# Patient Record
Sex: Female | Born: 1935 | Race: White | Hispanic: No | Marital: Married | Smoking: Never smoker
Health system: Southern US, Community
[De-identification: ages and names within clinical notes are randomized; demographics above are authoritative.]

## PROBLEM LIST (undated history)

## (undated) DIAGNOSIS — E7800 Pure hypercholesterolemia, unspecified: Secondary | ICD-10-CM

## (undated) DIAGNOSIS — E039 Hypothyroidism, unspecified: Secondary | ICD-10-CM

## (undated) DIAGNOSIS — I10 Essential (primary) hypertension: Secondary | ICD-10-CM

## (undated) HISTORY — DX: Essential (primary) hypertension: I10

## (undated) HISTORY — DX: Pure hypercholesterolemia, unspecified: E78.00

## (undated) HISTORY — PX: CATARACT EXTRACTION: SUR2

## (undated) HISTORY — DX: Hypothyroidism, unspecified: E03.9

## (undated) HISTORY — PX: TONSILLECTOMY AND ADENOIDECTOMY: SUR1326

---

## 2005-03-10 ENCOUNTER — Other Ambulatory Visit: Admission: RE | Admit: 2005-03-10 | Discharge: 2005-03-10 | Payer: Self-pay | Admitting: Internal Medicine

## 2006-03-16 ENCOUNTER — Ambulatory Visit (HOSPITAL_COMMUNITY): Admission: RE | Admit: 2006-03-16 | Discharge: 2006-03-16 | Payer: Self-pay | Admitting: Internal Medicine

## 2007-04-11 ENCOUNTER — Other Ambulatory Visit: Admission: RE | Admit: 2007-04-11 | Discharge: 2007-04-11 | Payer: Self-pay | Admitting: Internal Medicine

## 2012-12-14 ENCOUNTER — Ambulatory Visit (INDEPENDENT_AMBULATORY_CARE_PROVIDER_SITE_OTHER): Payer: Medicare Other | Admitting: Cardiology

## 2012-12-14 ENCOUNTER — Encounter: Payer: Self-pay | Admitting: Cardiology

## 2012-12-14 VITALS — BP 118/78 | HR 80 | Ht 66.0 in | Wt 147.0 lb

## 2012-12-14 DIAGNOSIS — R072 Precordial pain: Secondary | ICD-10-CM

## 2012-12-14 MED ORDER — NITROGLYCERIN 0.4 MG SL SUBL: 0.4000 mg | SUBLINGUAL_TABLET | SUBLINGUAL | Status: DC | PRN

## 2012-12-14 MED ORDER — METOPROLOL TARTRATE 25 MG PO TABS: 25.0000 mg | ORAL_TABLET | Freq: Every day | ORAL | Status: DC

## 2012-12-14 NOTE — Patient Instructions (Addendum)
The current medical regimen is effective;  continue present plan and medications.  Follow up with Dr Antoine Poche after your trip.

## 2012-12-14 NOTE — Progress Notes (Signed)
HPI The patient presents for evaluation of an abnormal stress test. She has no prior cardiac history. She did mention recently to her primary provider that she had some chest discomfort when she has been walking in the field. This will happen after a quarter of a mile. However, she's able to complete a mile in the discomfort goes away. She describes the mid brief chest discomfort. It is mild in intensity. There is no radiation to her neck or to her arms. She does not have associated symptoms. It only lasts for a few seconds at a time. She can do other vigorous activities such as vacuuming and heavy housework without bringing on this discomfort. It goes away spontaneously. It is not reproducible every time she exercises very she was sent for a stress echocardiogram which didn't suggest mild mid anterior wall hypokinesis. She did have shortness of breath. There were no diagnostic EKG changes. She was given a prescription for metoprolol and sublingual nitroglycerin but has not yet had these filled.  Allergies  Allergen Reactions  . Ace Inhibitors   . Amoxicillin   . Celebrex (Celecoxib)   . Crestor (Rosuvastatin)   . Iodine   . Statins     Current Outpatient Prescriptions  Medication Sig Dispense Refill  . aspirin 81 MG tablet Take 81 mg by mouth daily.      Marland Kitchen atorvastatin (LIPITOR) 10 MG tablet Take 1 tablet by mouth daily.      . Calcium Carbonate-Vitamin D (CALTRATE 600+D PO) Take by mouth 2 (two) times daily.      . fish oil-omega-3 fatty acids 1000 MG capsule Take 2 g by mouth daily.      Marland Kitchen levothyroxine (SYNTHROID, LEVOTHROID) 50 MCG tablet Take 50 mcg by mouth daily before breakfast.      . losartan-hydrochlorothiazide (HYZAAR) 100-25 MG per tablet Take 1 tablet by mouth daily.      . Multiple Vitamins-Calcium (ONE-A-DAY WOMENS PO) Take by mouth daily.      . polycarbophil (FIBERCON) 625 MG tablet Take 625 mg by mouth 2 (two) times daily.       No current facility-administered  medications for this visit.    Past Medical History  Diagnosis Date  . Chest pain   . Hypertension   . Hypothyroidism   . Hypercholesterolemia     Past Surgical History  Procedure Laterality Date  . Cataract extraction    . Tonsillectomy and adenoidectomy      Family History  Problem Relation Age of Onset  . CAD Father   . Hypertension Father   . Heart attack Father   . Lung cancer Sister     History   Social History  . Marital Status: Married    Spouse Name: N/A    Number of Children: N/A  . Years of Education: N/A   Occupational History  . Not on file.   Social History Main Topics  . Smoking status: Never Smoker   . Smokeless tobacco: Not on file  . Alcohol Use: Not on file  . Drug Use: Not on file  . Sexually Active: Not on file   Other Topics Concern  . Not on file   Social History Narrative  . No narrative on file    ROS:  Positive for back pain, occasional lightheadedness, urinary frequency, joint pains. Otherwise as stated in the history of present illness and negative for all other systems.  PHYSICAL EXAM BP 118/78  Pulse 80  Ht 5\' 6"  (1.676 m)  Wt 147 lb (66.679 kg)  BMI 23.74 kg/m2  SpO2 96% GENERAL:  Well appearing HEENT:  Pupils equal round and reactive, fundi not visualized, oral mucosa unremarkable NECK:  No jugular venous distention, waveform within normal limits, carotid upstroke brisk and symmetric, no bruits, no thyromegaly LYMPHATICS:  No cervical, inguinal adenopathy LUNGS:  Clear to auscultation bilaterally BACK:  No CVA tenderness CHEST:  Unremarkable HEART:  PMI not displaced or sustained,S1 and S2 within normal limits, no S3, no S4, no clicks, no rubs, slight apical systolic early peaking murmur, no diastolic murmurs ABD:  Flat, positive bowel sounds normal in frequency in pitch, no bruits, no rebound, no guarding, no midline pulsatile mass, no hepatomegaly, no splenomegaly EXT:  2 plus pulses throughout, no edema, no  cyanosis no clubbing SKIN:  No rashes no nodules NEURO:  Cranial nerves II through XII grossly intact, motor grossly intact throughout PSYCH:  Cognitively intact, oriented to person place and time   EKG:  Sinus rhythm, rate 65, axis within normal limits, intervals within normal limits, no acute ST-T wave changes.  11/09/12  ASSESSMENT AND PLAN   ABNORMAL STRESS TEST:  This stress test can be interpreted as low risk. She has chest pain that might be Class I angina. She has not yet started medical therapy. Therefore, current guidelines would suggest medical management and then reinterpretation of symptoms.  I agree with Toprol-XL 25 mg which has been prescribed. We also discussed when necessary dosing of nitroglycerin.

## 2013-02-21 ENCOUNTER — Encounter: Payer: Self-pay | Admitting: Cardiology

## 2013-03-01 ENCOUNTER — Encounter: Payer: Self-pay | Admitting: Cardiology

## 2013-03-01 ENCOUNTER — Ambulatory Visit (INDEPENDENT_AMBULATORY_CARE_PROVIDER_SITE_OTHER): Payer: Medicare Other | Admitting: Cardiology

## 2013-03-01 ENCOUNTER — Encounter: Payer: Self-pay | Admitting: *Deleted

## 2013-03-01 VITALS — BP 130/84 | HR 78 | Ht 66.0 in | Wt 151.0 lb

## 2013-03-01 DIAGNOSIS — Z01810 Encounter for preprocedural cardiovascular examination: Secondary | ICD-10-CM

## 2013-03-01 DIAGNOSIS — R072 Precordial pain: Secondary | ICD-10-CM

## 2013-03-01 LAB — BASIC METABOLIC PANEL
CO2: 32 mEq/L (ref 19–32)
Glucose, Bld: 90 mg/dL (ref 70–99)
Potassium: 3.6 mEq/L (ref 3.5–5.1)
Sodium: 134 mEq/L — ABNORMAL LOW (ref 135–145)

## 2013-03-01 LAB — CBC
Hemoglobin: 12.3 g/dL (ref 12.0–15.0)
MCHC: 34.1 g/dL (ref 30.0–36.0)
MCV: 89.6 fl (ref 78.0–100.0)
Platelets: 245 10*3/uL (ref 150.0–400.0)

## 2013-03-01 NOTE — Progress Notes (Signed)
HPI The patient presents for evaluation of an abnormal stress test. She has no prior cardiac history. She did mention recently to her primary provider that she had some chest discomfort when she has been walking in the field. She was sent for a stress echocardiogram which did suggest mild mid anterior wall hypokinesis. She did have shortness of breath. There were no diagnostic EKG changes. We had a long discussion about this at that time we decided since that his symptoms were somewhat low risk and class I and the stress echo results were not high risk that we would manage this conservatively. She actually went out of the country on some church work and did quite a bit of walking.  With this she didn't have any overt symptoms. However, upon coming back she's been getting some increase in discomfort. This might have been with activities. She describes it as an increasing burning discomfort. She says it does go away quickly. She is noticing it with less physical activity. She's not describing associated nausea vomiting or diaphoresis. She's not having palpitations, presyncope or syncope.   Allergies  Allergen Reactions  . Ace Inhibitors   . Amoxicillin   . Celebrex [Celecoxib]   . Crestor [Rosuvastatin]   . Iodine   . Statins     Current Outpatient Prescriptions  Medication Sig Dispense Refill  . aspirin 81 MG tablet Take 81 mg by mouth daily.      Marland Kitchen atorvastatin (LIPITOR) 10 MG tablet Take 1 tablet by mouth daily.      . Calcium Carbonate-Vitamin D (CALTRATE 600+D PO) Take by mouth 2 (two) times daily.      . fish oil-omega-3 fatty acids 1000 MG capsule Take 2 g by mouth daily.      Marland Kitchen levothyroxine (SYNTHROID, LEVOTHROID) 50 MCG tablet Take 50 mcg by mouth daily before breakfast.      . losartan-hydrochlorothiazide (HYZAAR) 100-25 MG per tablet Take 1 tablet by mouth daily.      . Multiple Vitamins-Calcium (ONE-A-DAY WOMENS PO) Take by mouth daily.      . nitroGLYCERIN (NITROSTAT) 0.4 MG SL  tablet Place 1 tablet (0.4 mg total) under the tongue every 5 (five) minutes as needed for chest pain.  25 tablet  prn  . polycarbophil (FIBERCON) 625 MG tablet Take 625 mg by mouth 2 (two) times daily.      Marland Kitchen amLODipine (NORVASC) 2.5 MG tablet TAKE ONE TABLET DAILY       No current facility-administered medications for this visit.    Past Medical History  Diagnosis Date  . Hypertension   . Hypothyroidism   . Hypercholesterolemia     Past Surgical History  Procedure Laterality Date  . Cataract extraction    . Tonsillectomy and adenoidectomy      ROS:  Positive for back pain, occasional lightheadedness, urinary frequency, joint pains. Otherwise as stated in the history of present illness and negative for all other systems.  PHYSICAL EXAM BP 130/84  Pulse 78  Ht 5\' 6"  (1.676 m)  Wt 151 lb (68.493 kg)  BMI 24.38 kg/m2  SpO2 98% GENERAL:  Well appearing HEENT:  Pupils equal round and reactive, fundi not visualized, oral mucosa unremarkable NECK:  No jugular venous distention, waveform within normal limits, carotid upstroke brisk and symmetric, no bruits, no thyromegaly LYMPHATICS:  No cervical, inguinal adenopathy LUNGS:  Clear to auscultation bilaterally BACK:  No CVA tenderness CHEST:  Unremarkable HEART:  PMI not displaced or sustained,S1 and S2 within normal limits,  no S3, no S4, no clicks, no rubs, slight apical systolic early peaking murmur, no diastolic murmurs ABD:  Flat, positive bowel sounds normal in frequency in pitch, no bruits, no rebound, no guarding, no midline pulsatile mass, no hepatomegaly, no splenomegaly EXT:  2 plus pulses throughout, no edema, no cyanosis no clubbing SKIN:  No rashes no nodules NEURO:  Cranial nerves II through XII grossly intact, motor grossly intact throughout PSYCH:  Cognitively intact, oriented to person place and time   EKG:  Sinus rhythm, rate 78, axis within normal limits, intervals within normal limits, no acute ST-T wave  changes.  03/01/2013  ASSESSMENT AND PLAN  Chest pain:  This seems to represent class III angina as it getting worse. She did have an abnormal stress perfusion study. Therefore, cardiac catheterization is indicated. The patient understands that risks included but are not limited to stroke (1 in 1000), death (1 in 1000), kidney failure [usually temporary] (1 in 500), bleeding (1 in 200), allergic reaction [possibly serious] (1 in 200).  The patient understands and agrees to proceed.     HTN:  The blood pressure is at target. No change in medications is indicated. We will continue with therapeutic lifestyle changes (TLC).

## 2013-03-01 NOTE — Patient Instructions (Addendum)
The current medical regimen is effective;  continue present plan and medications.  Your physician has requested that you have a cardiac catheterization. Cardiac catheterization is used to diagnose and/or treat various heart conditions. Doctors may recommend this procedure for a number of different reasons. The most common reason is to evaluate chest pain. Chest pain can be a symptom of coronary artery disease (CAD), and cardiac catheterization can show whether plaque is narrowing or blocking your heart's arteries. This procedure is also used to evaluate the valves, as well as measure the blood flow and oxygen levels in different parts of your heart. For further information please visit [email or web address] Please follow instruction sheet, as given.  Follow up will be scheduled after this procedure.

## 2013-03-02 ENCOUNTER — Encounter (HOSPITAL_BASED_OUTPATIENT_CLINIC_OR_DEPARTMENT_OTHER)
Admission: RE | Disposition: A | Discharge status: Home / Self Care | Payer: Self-pay | Source: Ambulatory Visit | Attending: Cardiology

## 2013-03-02 ENCOUNTER — Inpatient Hospital Stay (HOSPITAL_BASED_OUTPATIENT_CLINIC_OR_DEPARTMENT_OTHER)
Admission: RE | Admit: 2013-03-02 | Discharge: 2013-03-02 | Disposition: A | Discharge status: Home / Self Care | Payer: Medicare Other | Source: Ambulatory Visit | Attending: Cardiology | Admitting: Cardiology

## 2013-03-02 DIAGNOSIS — R9439 Abnormal result of other cardiovascular function study: Secondary | ICD-10-CM | POA: Insufficient documentation

## 2013-03-02 DIAGNOSIS — R079 Chest pain, unspecified: Secondary | ICD-10-CM | POA: Insufficient documentation

## 2013-03-02 DIAGNOSIS — I2510 Atherosclerotic heart disease of native coronary artery without angina pectoris: Secondary | ICD-10-CM | POA: Insufficient documentation

## 2013-03-02 SURGERY — JV LEFT HEART CATHETERIZATION WITH CORONARY ANGIOGRAM

## 2013-03-02 MED ORDER — SODIUM CHLORIDE 0.9 % IV SOLN: INTRAVENOUS | Status: DC

## 2013-03-02 MED ORDER — METHYLPREDNISOLONE SODIUM SUCC 125 MG IJ SOLR
125.0000 mg | Freq: Once | INTRAMUSCULAR | Status: DC
  Filled 2013-03-02: qty 2

## 2013-03-02 MED ORDER — ONDANSETRON HCL 4 MG/2ML IJ SOLN: 4.0000 mg | Freq: Four times a day (QID) | INTRAMUSCULAR | Status: DC | PRN

## 2013-03-02 MED ORDER — ACETAMINOPHEN 325 MG PO TABS: 650.0000 mg | ORAL_TABLET | ORAL | Status: DC | PRN

## 2013-03-02 NOTE — Progress Notes (Signed)
Voided 300cc of clear yellow urine.  Tolerated well

## 2013-03-02 NOTE — OR Nursing (Signed)
Meal served 

## 2013-03-02 NOTE — OR Nursing (Signed)
Discharge instructions reviewed and signed, pt stated understanding, ambulated in hall without difficulty, transported to husband's car via wheelchair

## 2013-03-02 NOTE — OR Nursing (Signed)
+  Allen's test right hand 

## 2013-03-02 NOTE — CV Procedure (Signed)
   Cardiac Catheterization Procedure Note  Name: Shelia Jimenez MRN: [id number] DOB: 03-20-1933  Procedure: Left Heart Cath, Selective Coronary Angiography, LV angiography  Indication:   Abnormal stress test with mild anterior wall motion abnormality.  Chest pain.   Procedural details: The right groin was prepped, draped, and anesthetized with 1% lidocaine. Using modified Seldinger technique, a 5 French sheath was introduced into the right femoral artery. Standard Judkins catheters were used for coronary angiography and left ventriculography. Catheter exchanges were performed over a guidewire. There were no immediate procedural complications. The patient was transferred to the post catheterization recovery area for further monitoring.  Procedural Findings:   Hemodynamics:     AO 124/86    LV 129/16   Coronary angiography:   Coronary dominance: Right  Left mainstem:   Normal, short  Left anterior descending (LAD):   Mild calcification with mid long 25%.  D1 and D2 small and normal.  Left circumflex (LCx):  AV groove short and normal.  Very large OM with multiple branches.  Normal throughout its course.    Right coronary artery (RCA):  Ostial 25%.  Large vessel wrapping the apex. PDA small and normal.    Left ventriculography: Left ventricular systolic function is normal, LVEF is estimated at 55%, there is no significant mitral regurgitation   Final Conclusions:   Mild nonobstructive plaque.  NL LV function  Recommendations:  No further cardiac work up.   Fayrene Fearing Shayda Kalka 03/02/2013, 11:30 AM

## 2013-03-02 NOTE — OR Nursing (Signed)
TR band removed, small pulsatile hematoma present, pressure held for 15 mins by M. Wright, hematoma reduced and pt stated relief of pain at site.

## 2013-03-02 NOTE — OR Nursing (Signed)
Dr Antoine Poche at bedside at 1140 to discuss results and treatment plan with pt and family

## 2013-03-17 ENCOUNTER — Ambulatory Visit (INDEPENDENT_AMBULATORY_CARE_PROVIDER_SITE_OTHER): Payer: Medicare Other | Admitting: Nurse Practitioner

## 2013-03-17 ENCOUNTER — Encounter: Payer: Self-pay | Admitting: Nurse Practitioner

## 2013-03-17 VITALS — BP 126/80 | HR 72 | Ht 66.0 in | Wt 151.0 lb

## 2013-03-17 DIAGNOSIS — Z9889 Other specified postprocedural states: Secondary | ICD-10-CM

## 2013-03-17 NOTE — Patient Instructions (Addendum)
Your heart catheterization turned out very good.  You do not need further heart testing from our standpoint  We will see you back as needed.   Call the [facility] office at [phone] if you have any questions, problems or concerns.

## 2013-03-17 NOTE — Progress Notes (Signed)
Annielee Jemmott Date of Birth: [birth year] Medical Record #[id number]  History of Present Illness: Ms. Kasal is seen back today for a post cath visit. She is seen for Dr. Antoine Poche. She has a history of HTN, HLD, & hypothyroidism.   Most recently seen earlier this month with class III angina and referred for cardiac cath. Results below. Has mild nonobstructive CAD - no further work up was felt to be needed.   Comes in today. Here with her husband. She is doing well. Tells me that she is really doing ok - thought she was doing ok prior - not really interested in further work up. No problems with her wrist.    Current Outpatient Prescriptions  Medication Sig Dispense Refill  . aspirin 81 MG tablet Take 81 mg by mouth daily.      Marland Kitchen atorvastatin (LIPITOR) 10 MG tablet Take 1 tablet by mouth daily.      . Calcium Carbonate-Vitamin D (CALTRATE 600+D PO) Take by mouth 2 (two) times daily.      . fish oil-omega-3 fatty acids 1000 MG capsule Take 2 g by mouth daily.      Marland Kitchen levothyroxine (SYNTHROID, LEVOTHROID) 50 MCG tablet Take 50 mcg by mouth daily before breakfast.      . losartan-hydrochlorothiazide (HYZAAR) 100-25 MG per tablet Take 1 tablet by mouth daily.      . Multiple Vitamins-Calcium (ONE-A-DAY WOMENS PO) Take by mouth daily.      . nitroGLYCERIN (NITROSTAT) 0.4 MG SL tablet Place 1 tablet (0.4 mg total) under the tongue every 5 (five) minutes as needed for chest pain.  25 tablet  prn  . polycarbophil (FIBERCON) 625 MG tablet Take 625 mg by mouth 2 (two) times daily.      Marland Kitchen amLODipine (NORVASC) 2.5 MG tablet TAKE ONE TABLET DAILY       No current facility-administered medications for this visit.    Allergies  Allergen Reactions  . Ace Inhibitors   . Amoxicillin   . Celebrex [Celecoxib]   . Crestor [Rosuvastatin]   . Iodine   . Statins     Past Medical History  Diagnosis Date  . Hypertension   . Hypothyroidism   . Hypercholesterolemia     Past Surgical History  Procedure  Laterality Date  . Cataract extraction    . Tonsillectomy and adenoidectomy      History  Smoking status  . Never Smoker   Smokeless tobacco  . Not on file    History  Alcohol Use No    Family History  Problem Relation Age of Onset  . CAD Father 57  . Hypertension Father   . Lung cancer Sister   . Aneurysm Mother     "heart aneurysm"    Review of Systems: The review of systems is per the HPI.  All other systems were reviewed and are negative.  Physical Exam: BP 126/80  Pulse 72  Ht 5\' 6"  (1.676 m)  Wt 151 lb (68.493 kg)  BMI 24.38 kg/m2 Patient is very pleasant and in no acute distress. Skin is warm and dry. Color is normal.  HEENT is unremarkable. Normocephalic/atraumatic. PERRL. Sclera are nonicteric. Neck is supple. No masses. No JVD. Lungs are clear. Cardiac exam shows a regular rate and rhythm. Abdomen is soft. Extremities are without edema. Gait and ROM are intact. No gross neurologic deficits noted.  LABORATORY DATA: Lab Results  Component Value Date   WBC 6.8 03/01/2013   HGB 12.3 03/01/2013   HCT 36.2  03/01/2013   PLT 245.0 03/01/2013   GLUCOSE 90 03/01/2013   NA 134* 03/01/2013   K 3.6 03/01/2013   CL 96 03/01/2013   CREATININE 1.0 03/01/2013   BUN 17 03/01/2013   CO2 32 03/01/2013   INR 1.1* 03/01/2013   Coronary angiography:  Coronary dominance: Right  Left mainstem: Normal, short  Left anterior descending (LAD): Mild calcification with mid long 25%. D1 and D2 small and normal.  Left circumflex (LCx): AV groove short and normal. Very large OM with multiple branches. Normal throughout its course.  Right coronary artery (RCA): Ostial 25%. Large vessel wrapping the apex. PDA small and normal.  Left ventriculography: Left ventricular systolic function is normal, LVEF is estimated at 55%, there is no significant mitral regurgitation  Final Conclusions: Mild nonobstructive plaque. NL LV function  Recommendations: No further cardiac work up.  Fayrene Fearing Hochrein  03/02/2013, 11:30  AM   Assessment / Plan: 1. S/P cardiac cath - no obstructive disease - normal EF - felt to be stable from our standpoint. She is not interested in further work up.   2. HTN  3. HLD  She will see her PCP as needed. We will see back prn. Otherwise defer to her PCP.  Patient is agreeable to this plan and will call if any problems develop in the interim.   Rosalio Macadamia, RN, ANP-C [facility] [address] [place], [place]  [address]

## 2013-06-29 HISTORY — PX: MENISECTOMY: SHX5181

## 2014-09-25 DIAGNOSIS — M25561 Pain in right knee: Secondary | ICD-10-CM | POA: Diagnosis not present

## 2014-09-25 DIAGNOSIS — M4806 Spinal stenosis, lumbar region: Secondary | ICD-10-CM | POA: Diagnosis not present

## 2014-09-25 DIAGNOSIS — E782 Mixed hyperlipidemia: Secondary | ICD-10-CM | POA: Diagnosis not present

## 2014-09-25 DIAGNOSIS — E038 Other specified hypothyroidism: Secondary | ICD-10-CM | POA: Diagnosis not present

## 2014-09-25 DIAGNOSIS — E780 Pure hypercholesterolemia: Secondary | ICD-10-CM | POA: Diagnosis not present

## 2014-09-25 DIAGNOSIS — I10 Essential (primary) hypertension: Secondary | ICD-10-CM | POA: Diagnosis not present

## 2014-10-15 DIAGNOSIS — M1711 Unilateral primary osteoarthritis, right knee: Secondary | ICD-10-CM | POA: Diagnosis not present

## 2014-10-31 DIAGNOSIS — M25561 Pain in right knee: Secondary | ICD-10-CM | POA: Diagnosis not present

## 2014-10-31 DIAGNOSIS — M1711 Unilateral primary osteoarthritis, right knee: Secondary | ICD-10-CM | POA: Diagnosis not present

## 2014-11-07 DIAGNOSIS — M25561 Pain in right knee: Secondary | ICD-10-CM | POA: Diagnosis not present

## 2014-11-07 DIAGNOSIS — M1711 Unilateral primary osteoarthritis, right knee: Secondary | ICD-10-CM | POA: Diagnosis not present

## 2014-11-14 DIAGNOSIS — M1711 Unilateral primary osteoarthritis, right knee: Secondary | ICD-10-CM | POA: Diagnosis not present

## 2015-01-16 DIAGNOSIS — J069 Acute upper respiratory infection, unspecified: Secondary | ICD-10-CM | POA: Diagnosis not present

## 2015-02-19 DIAGNOSIS — H3531 Nonexudative age-related macular degeneration: Secondary | ICD-10-CM | POA: Diagnosis not present

## 2015-03-11 DIAGNOSIS — N3001 Acute cystitis with hematuria: Secondary | ICD-10-CM | POA: Diagnosis not present

## 2015-04-16 DIAGNOSIS — Z23 Encounter for immunization: Secondary | ICD-10-CM | POA: Diagnosis not present

## 2015-04-25 DIAGNOSIS — M1711 Unilateral primary osteoarthritis, right knee: Secondary | ICD-10-CM | POA: Diagnosis not present

## 2015-04-30 DIAGNOSIS — Z1211 Encounter for screening for malignant neoplasm of colon: Secondary | ICD-10-CM | POA: Diagnosis not present

## 2015-04-30 DIAGNOSIS — Z6826 Body mass index (BMI) 26.0-26.9, adult: Secondary | ICD-10-CM | POA: Diagnosis not present

## 2015-04-30 DIAGNOSIS — Z0001 Encounter for general adult medical examination with abnormal findings: Secondary | ICD-10-CM | POA: Diagnosis not present

## 2015-04-30 DIAGNOSIS — Z23 Encounter for immunization: Secondary | ICD-10-CM | POA: Diagnosis not present

## 2015-04-30 DIAGNOSIS — N3000 Acute cystitis without hematuria: Secondary | ICD-10-CM | POA: Diagnosis not present

## 2015-05-09 DIAGNOSIS — Z1231 Encounter for screening mammogram for malignant neoplasm of breast: Secondary | ICD-10-CM | POA: Diagnosis not present

## 2015-08-07 DIAGNOSIS — M4806 Spinal stenosis, lumbar region: Secondary | ICD-10-CM | POA: Diagnosis not present

## 2015-08-07 DIAGNOSIS — M25561 Pain in right knee: Secondary | ICD-10-CM | POA: Diagnosis not present

## 2015-08-07 DIAGNOSIS — I10 Essential (primary) hypertension: Secondary | ICD-10-CM | POA: Diagnosis not present

## 2015-08-07 DIAGNOSIS — E782 Mixed hyperlipidemia: Secondary | ICD-10-CM | POA: Diagnosis not present

## 2015-08-13 DIAGNOSIS — H16223 Keratoconjunctivitis sicca, not specified as Sjogren's, bilateral: Secondary | ICD-10-CM | POA: Diagnosis not present

## 2015-08-20 DIAGNOSIS — N3001 Acute cystitis with hematuria: Secondary | ICD-10-CM | POA: Diagnosis not present

## 2015-08-20 DIAGNOSIS — J00 Acute nasopharyngitis [common cold]: Secondary | ICD-10-CM | POA: Diagnosis not present

## 2015-11-14 DIAGNOSIS — N3001 Acute cystitis with hematuria: Secondary | ICD-10-CM | POA: Diagnosis not present

## 2015-11-21 DIAGNOSIS — N3001 Acute cystitis with hematuria: Secondary | ICD-10-CM | POA: Diagnosis not present

## 2016-02-11 DIAGNOSIS — I10 Essential (primary) hypertension: Secondary | ICD-10-CM | POA: Diagnosis not present

## 2016-02-11 DIAGNOSIS — M4806 Spinal stenosis, lumbar region: Secondary | ICD-10-CM | POA: Diagnosis not present

## 2016-02-11 DIAGNOSIS — E782 Mixed hyperlipidemia: Secondary | ICD-10-CM | POA: Diagnosis not present

## 2016-02-11 DIAGNOSIS — N952 Postmenopausal atrophic vaginitis: Secondary | ICD-10-CM | POA: Diagnosis not present

## 2016-02-11 DIAGNOSIS — E038 Other specified hypothyroidism: Secondary | ICD-10-CM | POA: Diagnosis not present

## 2016-04-22 DIAGNOSIS — Z23 Encounter for immunization: Secondary | ICD-10-CM | POA: Diagnosis not present

## 2016-05-11 DIAGNOSIS — L821 Other seborrheic keratosis: Secondary | ICD-10-CM | POA: Diagnosis not present

## 2016-05-11 DIAGNOSIS — L570 Actinic keratosis: Secondary | ICD-10-CM | POA: Diagnosis not present

## 2016-05-18 DIAGNOSIS — E782 Mixed hyperlipidemia: Secondary | ICD-10-CM | POA: Diagnosis not present

## 2016-05-18 DIAGNOSIS — E038 Other specified hypothyroidism: Secondary | ICD-10-CM | POA: Diagnosis not present

## 2016-05-18 DIAGNOSIS — M48061 Spinal stenosis, lumbar region without neurogenic claudication: Secondary | ICD-10-CM | POA: Diagnosis not present

## 2016-05-18 DIAGNOSIS — I10 Essential (primary) hypertension: Secondary | ICD-10-CM | POA: Diagnosis not present

## 2016-05-19 DIAGNOSIS — Z1231 Encounter for screening mammogram for malignant neoplasm of breast: Secondary | ICD-10-CM | POA: Diagnosis not present

## 2016-08-05 DIAGNOSIS — L570 Actinic keratosis: Secondary | ICD-10-CM | POA: Diagnosis not present

## 2016-08-05 DIAGNOSIS — L301 Dyshidrosis [pompholyx]: Secondary | ICD-10-CM | POA: Diagnosis not present

## 2016-08-05 DIAGNOSIS — L578 Other skin changes due to chronic exposure to nonionizing radiation: Secondary | ICD-10-CM | POA: Diagnosis not present

## 2016-09-01 DIAGNOSIS — H2703 Aphakia, bilateral: Secondary | ICD-10-CM | POA: Diagnosis not present

## 2016-09-01 DIAGNOSIS — H3530 Unspecified macular degeneration: Secondary | ICD-10-CM | POA: Diagnosis not present

## 2016-10-07 DIAGNOSIS — S60459A Superficial foreign body of unspecified finger, initial encounter: Secondary | ICD-10-CM | POA: Diagnosis not present

## 2016-10-07 DIAGNOSIS — L301 Dyshidrosis [pompholyx]: Secondary | ICD-10-CM | POA: Diagnosis not present

## 2016-12-16 DIAGNOSIS — E038 Other specified hypothyroidism: Secondary | ICD-10-CM | POA: Diagnosis not present

## 2016-12-16 DIAGNOSIS — I10 Essential (primary) hypertension: Secondary | ICD-10-CM | POA: Diagnosis not present

## 2016-12-16 DIAGNOSIS — E782 Mixed hyperlipidemia: Secondary | ICD-10-CM | POA: Diagnosis not present

## 2016-12-16 DIAGNOSIS — R202 Paresthesia of skin: Secondary | ICD-10-CM | POA: Diagnosis not present

## 2017-07-01 DIAGNOSIS — M25562 Pain in left knee: Secondary | ICD-10-CM | POA: Diagnosis not present

## 2017-07-01 DIAGNOSIS — M25561 Pain in right knee: Secondary | ICD-10-CM | POA: Diagnosis not present

## 2017-07-01 DIAGNOSIS — E782 Mixed hyperlipidemia: Secondary | ICD-10-CM | POA: Diagnosis not present

## 2017-07-01 DIAGNOSIS — I10 Essential (primary) hypertension: Secondary | ICD-10-CM | POA: Diagnosis not present

## 2017-07-01 DIAGNOSIS — E039 Hypothyroidism, unspecified: Secondary | ICD-10-CM | POA: Diagnosis not present

## 2017-10-13 DIAGNOSIS — M25561 Pain in right knee: Secondary | ICD-10-CM | POA: Diagnosis not present

## 2017-10-13 DIAGNOSIS — M25551 Pain in right hip: Secondary | ICD-10-CM | POA: Diagnosis not present

## 2017-10-13 DIAGNOSIS — M25562 Pain in left knee: Secondary | ICD-10-CM | POA: Diagnosis not present

## 2017-10-13 DIAGNOSIS — Z9889 Other specified postprocedural states: Secondary | ICD-10-CM | POA: Diagnosis not present

## 2017-11-03 DIAGNOSIS — R69 Illness, unspecified: Secondary | ICD-10-CM | POA: Diagnosis not present

## 2017-11-17 DIAGNOSIS — M545 Low back pain, unspecified: Secondary | ICD-10-CM | POA: Insufficient documentation

## 2017-11-17 DIAGNOSIS — M25551 Pain in right hip: Secondary | ICD-10-CM | POA: Diagnosis not present

## 2017-11-17 DIAGNOSIS — M4180 Other forms of scoliosis, site unspecified: Secondary | ICD-10-CM | POA: Diagnosis not present

## 2017-11-23 DIAGNOSIS — R69 Illness, unspecified: Secondary | ICD-10-CM | POA: Diagnosis not present

## 2017-12-29 DIAGNOSIS — E039 Hypothyroidism, unspecified: Secondary | ICD-10-CM | POA: Diagnosis not present

## 2017-12-29 DIAGNOSIS — R1310 Dysphagia, unspecified: Secondary | ICD-10-CM | POA: Diagnosis not present

## 2017-12-29 DIAGNOSIS — Z0000 Encounter for general adult medical examination without abnormal findings: Secondary | ICD-10-CM | POA: Diagnosis not present

## 2017-12-29 DIAGNOSIS — Z1389 Encounter for screening for other disorder: Secondary | ICD-10-CM | POA: Diagnosis not present

## 2017-12-29 DIAGNOSIS — R1013 Epigastric pain: Secondary | ICD-10-CM | POA: Diagnosis not present

## 2017-12-29 DIAGNOSIS — I10 Essential (primary) hypertension: Secondary | ICD-10-CM | POA: Diagnosis not present

## 2017-12-29 DIAGNOSIS — E782 Mixed hyperlipidemia: Secondary | ICD-10-CM | POA: Diagnosis not present

## 2018-02-01 ENCOUNTER — Other Ambulatory Visit: Payer: Self-pay | Admitting: Gastroenterology

## 2018-02-01 DIAGNOSIS — R1013 Epigastric pain: Secondary | ICD-10-CM | POA: Diagnosis not present

## 2018-02-01 DIAGNOSIS — R1313 Dysphagia, pharyngeal phase: Secondary | ICD-10-CM

## 2018-02-04 ENCOUNTER — Ambulatory Visit
Admission: RE | Admit: 2018-02-04 | Discharge: 2018-02-04 | Disposition: A | Discharge status: Home / Self Care | Payer: Self-pay | Source: Ambulatory Visit | Attending: Gastroenterology | Admitting: Gastroenterology

## 2018-02-04 DIAGNOSIS — R1310 Dysphagia, unspecified: Secondary | ICD-10-CM | POA: Diagnosis not present

## 2018-02-04 DIAGNOSIS — R1313 Dysphagia, pharyngeal phase: Secondary | ICD-10-CM

## 2018-02-07 DIAGNOSIS — Z1231 Encounter for screening mammogram for malignant neoplasm of breast: Secondary | ICD-10-CM | POA: Diagnosis not present

## 2018-02-09 DIAGNOSIS — H26492 Other secondary cataract, left eye: Secondary | ICD-10-CM | POA: Diagnosis not present

## 2018-02-09 DIAGNOSIS — H04123 Dry eye syndrome of bilateral lacrimal glands: Secondary | ICD-10-CM | POA: Diagnosis not present

## 2018-02-09 DIAGNOSIS — Z961 Presence of intraocular lens: Secondary | ICD-10-CM | POA: Diagnosis not present

## 2018-02-09 DIAGNOSIS — H35439 Paving stone degeneration of retina, unspecified eye: Secondary | ICD-10-CM | POA: Diagnosis not present

## 2018-03-16 DIAGNOSIS — R1313 Dysphagia, pharyngeal phase: Secondary | ICD-10-CM | POA: Diagnosis not present

## 2018-03-16 DIAGNOSIS — R1013 Epigastric pain: Secondary | ICD-10-CM | POA: Diagnosis not present

## 2018-05-03 DIAGNOSIS — R69 Illness, unspecified: Secondary | ICD-10-CM | POA: Diagnosis not present

## 2018-06-13 DIAGNOSIS — M2550 Pain in unspecified joint: Secondary | ICD-10-CM | POA: Diagnosis not present

## 2018-06-13 DIAGNOSIS — I10 Essential (primary) hypertension: Secondary | ICD-10-CM | POA: Diagnosis not present

## 2018-06-13 DIAGNOSIS — M791 Myalgia, unspecified site: Secondary | ICD-10-CM | POA: Diagnosis not present

## 2018-06-28 DIAGNOSIS — I10 Essential (primary) hypertension: Secondary | ICD-10-CM | POA: Diagnosis not present

## 2018-06-28 DIAGNOSIS — M79641 Pain in right hand: Secondary | ICD-10-CM | POA: Diagnosis not present

## 2018-06-28 DIAGNOSIS — M170 Bilateral primary osteoarthritis of knee: Secondary | ICD-10-CM | POA: Diagnosis not present

## 2018-06-28 DIAGNOSIS — M791 Myalgia, unspecified site: Secondary | ICD-10-CM | POA: Diagnosis not present

## 2018-06-28 DIAGNOSIS — M19011 Primary osteoarthritis, right shoulder: Secondary | ICD-10-CM | POA: Diagnosis not present

## 2018-06-28 DIAGNOSIS — M2550 Pain in unspecified joint: Secondary | ICD-10-CM | POA: Diagnosis not present

## 2018-06-28 DIAGNOSIS — M79642 Pain in left hand: Secondary | ICD-10-CM | POA: Diagnosis not present

## 2018-06-28 DIAGNOSIS — M19012 Primary osteoarthritis, left shoulder: Secondary | ICD-10-CM | POA: Diagnosis not present

## 2018-06-28 DIAGNOSIS — M1990 Unspecified osteoarthritis, unspecified site: Secondary | ICD-10-CM | POA: Diagnosis not present

## 2018-06-28 DIAGNOSIS — M25511 Pain in right shoulder: Secondary | ICD-10-CM | POA: Diagnosis not present

## 2018-06-28 DIAGNOSIS — M25561 Pain in right knee: Secondary | ICD-10-CM | POA: Diagnosis not present

## 2018-06-28 DIAGNOSIS — R768 Other specified abnormal immunological findings in serum: Secondary | ICD-10-CM | POA: Diagnosis not present

## 2018-06-28 DIAGNOSIS — M810 Age-related osteoporosis without current pathological fracture: Secondary | ICD-10-CM | POA: Diagnosis not present

## 2018-06-28 DIAGNOSIS — M25512 Pain in left shoulder: Secondary | ICD-10-CM | POA: Diagnosis not present

## 2018-06-28 DIAGNOSIS — M353 Polymyalgia rheumatica: Secondary | ICD-10-CM | POA: Diagnosis not present

## 2018-06-28 DIAGNOSIS — M180 Bilateral primary osteoarthritis of first carpometacarpal joints: Secondary | ICD-10-CM | POA: Diagnosis not present

## 2018-07-06 DIAGNOSIS — I10 Essential (primary) hypertension: Secondary | ICD-10-CM | POA: Diagnosis not present

## 2018-07-06 DIAGNOSIS — E039 Hypothyroidism, unspecified: Secondary | ICD-10-CM | POA: Diagnosis not present

## 2018-07-06 DIAGNOSIS — E782 Mixed hyperlipidemia: Secondary | ICD-10-CM | POA: Diagnosis not present

## 2018-07-13 DIAGNOSIS — M2550 Pain in unspecified joint: Secondary | ICD-10-CM | POA: Diagnosis not present

## 2018-07-13 DIAGNOSIS — M353 Polymyalgia rheumatica: Secondary | ICD-10-CM | POA: Diagnosis not present

## 2018-07-13 DIAGNOSIS — M1990 Unspecified osteoarthritis, unspecified site: Secondary | ICD-10-CM | POA: Diagnosis not present

## 2018-07-13 DIAGNOSIS — M791 Myalgia, unspecified site: Secondary | ICD-10-CM | POA: Diagnosis not present

## 2018-07-13 DIAGNOSIS — I10 Essential (primary) hypertension: Secondary | ICD-10-CM | POA: Diagnosis not present

## 2018-07-13 DIAGNOSIS — R768 Other specified abnormal immunological findings in serum: Secondary | ICD-10-CM | POA: Diagnosis not present

## 2018-07-13 DIAGNOSIS — M810 Age-related osteoporosis without current pathological fracture: Secondary | ICD-10-CM | POA: Diagnosis not present

## 2018-10-12 DIAGNOSIS — M2550 Pain in unspecified joint: Secondary | ICD-10-CM | POA: Diagnosis not present

## 2018-10-12 DIAGNOSIS — M810 Age-related osteoporosis without current pathological fracture: Secondary | ICD-10-CM | POA: Diagnosis not present

## 2018-10-12 DIAGNOSIS — R768 Other specified abnormal immunological findings in serum: Secondary | ICD-10-CM | POA: Diagnosis not present

## 2018-10-12 DIAGNOSIS — I10 Essential (primary) hypertension: Secondary | ICD-10-CM | POA: Diagnosis not present

## 2018-10-12 DIAGNOSIS — M353 Polymyalgia rheumatica: Secondary | ICD-10-CM | POA: Diagnosis not present

## 2018-10-12 DIAGNOSIS — M791 Myalgia, unspecified site: Secondary | ICD-10-CM | POA: Diagnosis not present

## 2018-10-12 DIAGNOSIS — M1990 Unspecified osteoarthritis, unspecified site: Secondary | ICD-10-CM | POA: Diagnosis not present

## 2018-12-11 DIAGNOSIS — R350 Frequency of micturition: Secondary | ICD-10-CM | POA: Diagnosis not present

## 2019-01-06 DIAGNOSIS — R69 Illness, unspecified: Secondary | ICD-10-CM | POA: Diagnosis not present

## 2019-01-09 DIAGNOSIS — Z0000 Encounter for general adult medical examination without abnormal findings: Secondary | ICD-10-CM | POA: Diagnosis not present

## 2019-01-09 DIAGNOSIS — I10 Essential (primary) hypertension: Secondary | ICD-10-CM | POA: Diagnosis not present

## 2019-01-09 DIAGNOSIS — Z1389 Encounter for screening for other disorder: Secondary | ICD-10-CM | POA: Diagnosis not present

## 2019-01-09 DIAGNOSIS — E039 Hypothyroidism, unspecified: Secondary | ICD-10-CM | POA: Diagnosis not present

## 2019-01-09 DIAGNOSIS — E782 Mixed hyperlipidemia: Secondary | ICD-10-CM | POA: Diagnosis not present

## 2019-01-11 DIAGNOSIS — I10 Essential (primary) hypertension: Secondary | ICD-10-CM | POA: Diagnosis not present

## 2019-01-11 DIAGNOSIS — M791 Myalgia, unspecified site: Secondary | ICD-10-CM | POA: Diagnosis not present

## 2019-01-11 DIAGNOSIS — R768 Other specified abnormal immunological findings in serum: Secondary | ICD-10-CM | POA: Diagnosis not present

## 2019-01-11 DIAGNOSIS — M810 Age-related osteoporosis without current pathological fracture: Secondary | ICD-10-CM | POA: Diagnosis not present

## 2019-01-11 DIAGNOSIS — M2550 Pain in unspecified joint: Secondary | ICD-10-CM | POA: Diagnosis not present

## 2019-01-11 DIAGNOSIS — M1990 Unspecified osteoarthritis, unspecified site: Secondary | ICD-10-CM | POA: Diagnosis not present

## 2019-01-11 DIAGNOSIS — M353 Polymyalgia rheumatica: Secondary | ICD-10-CM | POA: Diagnosis not present

## 2019-02-02 DIAGNOSIS — R69 Illness, unspecified: Secondary | ICD-10-CM | POA: Diagnosis not present

## 2019-02-09 DIAGNOSIS — Z1231 Encounter for screening mammogram for malignant neoplasm of breast: Secondary | ICD-10-CM | POA: Diagnosis not present

## 2019-02-15 DIAGNOSIS — H47233 Glaucomatous optic atrophy, bilateral: Secondary | ICD-10-CM | POA: Diagnosis not present

## 2019-02-15 DIAGNOSIS — Z961 Presence of intraocular lens: Secondary | ICD-10-CM | POA: Diagnosis not present

## 2019-02-15 DIAGNOSIS — H35433 Paving stone degeneration of retina, bilateral: Secondary | ICD-10-CM | POA: Diagnosis not present

## 2019-02-15 DIAGNOSIS — H04123 Dry eye syndrome of bilateral lacrimal glands: Secondary | ICD-10-CM | POA: Diagnosis not present

## 2019-03-09 DIAGNOSIS — N3000 Acute cystitis without hematuria: Secondary | ICD-10-CM | POA: Diagnosis not present

## 2019-03-09 DIAGNOSIS — R399 Unspecified symptoms and signs involving the genitourinary system: Secondary | ICD-10-CM | POA: Diagnosis not present

## 2019-03-14 DIAGNOSIS — M79604 Pain in right leg: Secondary | ICD-10-CM | POA: Diagnosis not present

## 2019-03-14 DIAGNOSIS — I10 Essential (primary) hypertension: Secondary | ICD-10-CM | POA: Diagnosis not present

## 2019-03-14 DIAGNOSIS — M5030 Other cervical disc degeneration, unspecified cervical region: Secondary | ICD-10-CM | POA: Diagnosis not present

## 2019-03-14 DIAGNOSIS — M5136 Other intervertebral disc degeneration, lumbar region: Secondary | ICD-10-CM | POA: Diagnosis not present

## 2019-03-14 DIAGNOSIS — M4800 Spinal stenosis, site unspecified: Secondary | ICD-10-CM | POA: Diagnosis not present

## 2019-03-14 DIAGNOSIS — R768 Other specified abnormal immunological findings in serum: Secondary | ICD-10-CM | POA: Diagnosis not present

## 2019-03-14 DIAGNOSIS — M791 Myalgia, unspecified site: Secondary | ICD-10-CM | POA: Diagnosis not present

## 2019-03-14 DIAGNOSIS — M810 Age-related osteoporosis without current pathological fracture: Secondary | ICD-10-CM | POA: Diagnosis not present

## 2019-03-14 DIAGNOSIS — M1990 Unspecified osteoarthritis, unspecified site: Secondary | ICD-10-CM | POA: Diagnosis not present

## 2019-03-14 DIAGNOSIS — M353 Polymyalgia rheumatica: Secondary | ICD-10-CM | POA: Diagnosis not present

## 2019-03-20 DIAGNOSIS — M5136 Other intervertebral disc degeneration, lumbar region: Secondary | ICD-10-CM | POA: Diagnosis not present

## 2019-03-29 DIAGNOSIS — M545 Low back pain: Secondary | ICD-10-CM | POA: Diagnosis not present

## 2019-04-03 DIAGNOSIS — M545 Low back pain: Secondary | ICD-10-CM | POA: Diagnosis not present

## 2019-04-05 DIAGNOSIS — R202 Paresthesia of skin: Secondary | ICD-10-CM | POA: Diagnosis not present

## 2019-04-05 DIAGNOSIS — Z23 Encounter for immunization: Secondary | ICD-10-CM | POA: Diagnosis not present

## 2019-04-05 DIAGNOSIS — R69 Illness, unspecified: Secondary | ICD-10-CM | POA: Diagnosis not present

## 2019-04-07 DIAGNOSIS — M545 Low back pain: Secondary | ICD-10-CM | POA: Diagnosis not present

## 2019-04-17 DIAGNOSIS — M545 Low back pain: Secondary | ICD-10-CM | POA: Diagnosis not present

## 2019-04-19 DIAGNOSIS — M545 Low back pain: Secondary | ICD-10-CM | POA: Diagnosis not present

## 2019-04-20 DIAGNOSIS — M545 Low back pain: Secondary | ICD-10-CM | POA: Diagnosis not present

## 2019-04-24 DIAGNOSIS — M545 Low back pain: Secondary | ICD-10-CM | POA: Diagnosis not present

## 2019-04-26 DIAGNOSIS — M545 Low back pain: Secondary | ICD-10-CM | POA: Diagnosis not present

## 2019-04-29 DIAGNOSIS — M545 Low back pain: Secondary | ICD-10-CM | POA: Diagnosis not present

## 2019-05-04 DIAGNOSIS — M48061 Spinal stenosis, lumbar region without neurogenic claudication: Secondary | ICD-10-CM | POA: Insufficient documentation

## 2019-05-04 DIAGNOSIS — M4316 Spondylolisthesis, lumbar region: Secondary | ICD-10-CM | POA: Diagnosis not present

## 2019-05-04 DIAGNOSIS — M48062 Spinal stenosis, lumbar region with neurogenic claudication: Secondary | ICD-10-CM | POA: Diagnosis not present

## 2019-05-08 DIAGNOSIS — I10 Essential (primary) hypertension: Secondary | ICD-10-CM | POA: Diagnosis not present

## 2019-05-08 DIAGNOSIS — R69 Illness, unspecified: Secondary | ICD-10-CM | POA: Diagnosis not present

## 2019-05-08 DIAGNOSIS — M5136 Other intervertebral disc degeneration, lumbar region: Secondary | ICD-10-CM | POA: Diagnosis not present

## 2019-05-16 DIAGNOSIS — I10 Essential (primary) hypertension: Secondary | ICD-10-CM | POA: Diagnosis not present

## 2019-05-16 DIAGNOSIS — M4800 Spinal stenosis, site unspecified: Secondary | ICD-10-CM | POA: Diagnosis not present

## 2019-05-16 DIAGNOSIS — R768 Other specified abnormal immunological findings in serum: Secondary | ICD-10-CM | POA: Diagnosis not present

## 2019-05-16 DIAGNOSIS — M1990 Unspecified osteoarthritis, unspecified site: Secondary | ICD-10-CM | POA: Diagnosis not present

## 2019-05-16 DIAGNOSIS — M79604 Pain in right leg: Secondary | ICD-10-CM | POA: Diagnosis not present

## 2019-05-16 DIAGNOSIS — M5136 Other intervertebral disc degeneration, lumbar region: Secondary | ICD-10-CM | POA: Diagnosis not present

## 2019-05-16 DIAGNOSIS — M5030 Other cervical disc degeneration, unspecified cervical region: Secondary | ICD-10-CM | POA: Diagnosis not present

## 2019-05-16 DIAGNOSIS — M353 Polymyalgia rheumatica: Secondary | ICD-10-CM | POA: Diagnosis not present

## 2019-05-16 DIAGNOSIS — M791 Myalgia, unspecified site: Secondary | ICD-10-CM | POA: Diagnosis not present

## 2019-05-16 DIAGNOSIS — M810 Age-related osteoporosis without current pathological fracture: Secondary | ICD-10-CM | POA: Diagnosis not present

## 2019-05-30 DIAGNOSIS — M5416 Radiculopathy, lumbar region: Secondary | ICD-10-CM | POA: Diagnosis not present

## 2019-06-06 IMAGING — RF DG ESOPHAGUS
12 series · 14 of 24 positions shown · non-contrast
Comparison: None.

CLINICAL DATA: Esophageal dysphagia

EXAM:
ESOPHOGRAM / BARIUM SWALLOW / BARIUM TABLET STUDY
TECHNIQUE: Combined double contrast and single contrast examination performed
using effervescent crystals, thick barium liquid, and thin barium
liquid. The patient was observed with fluoroscopy swallowing a 13 mm
barium sulphate tablet.
FLUOROSCOPY TIME:  Fluoroscopy Time:  2 minutes and 42 seconds
Radiation Exposure Index (if provided by the fluoroscopic device):
85 mGy
Number of Acquired Spot Images: 0

[Series 1: sequence · 0.31mm/px · 1 of 13 frames shown (1 of 11)]
[frame 2/13]
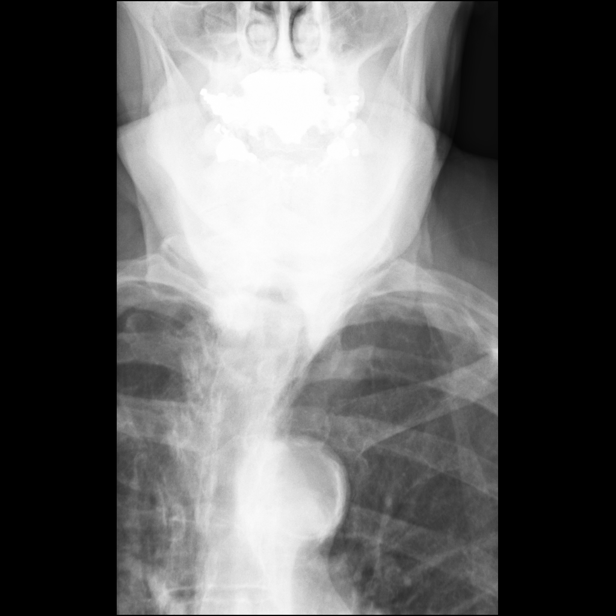

[Series 2: sequence · 0.31mm/px · 1 of 9 frames shown (2 of 11)]
[frame 5/9]
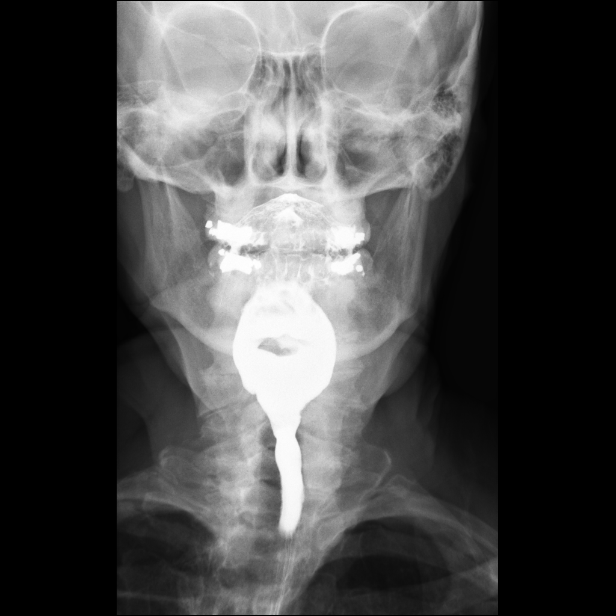

[Series 3: sequence · 0.31mm/px · 1 of 12 frames shown (3 of 11)]
[frame 3/12]
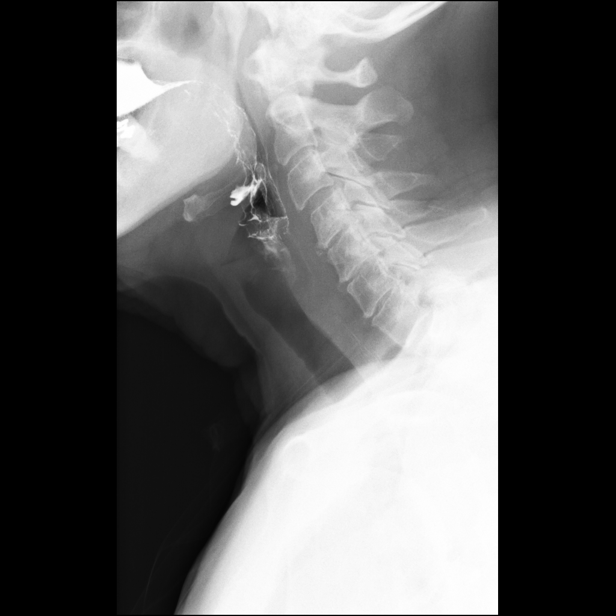

[Series 4: sequence · 0.28mm/px · 1 of 11 frames shown (4 of 11)]
[frame 6/11]
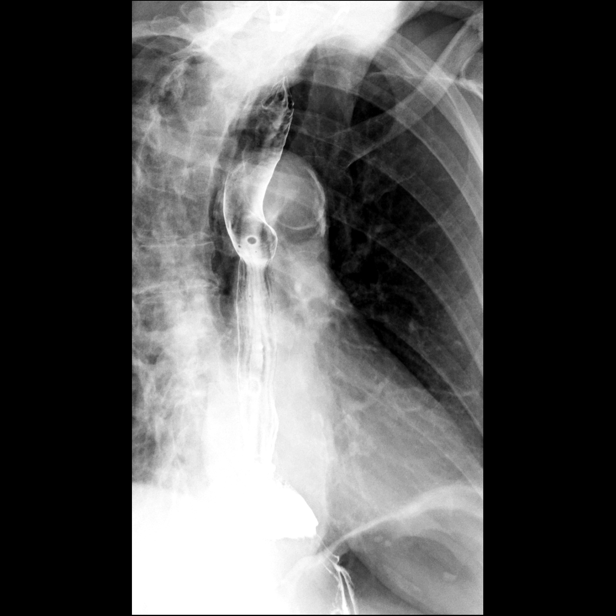

[Series 5: sequence · 0.28mm/px · 2 of 13 frames shown (5 of 11)]
[frame 2/13]
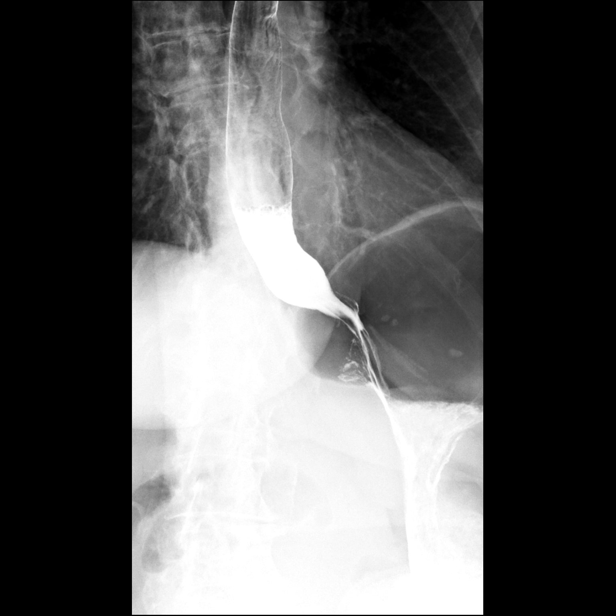
[frame 12/13]
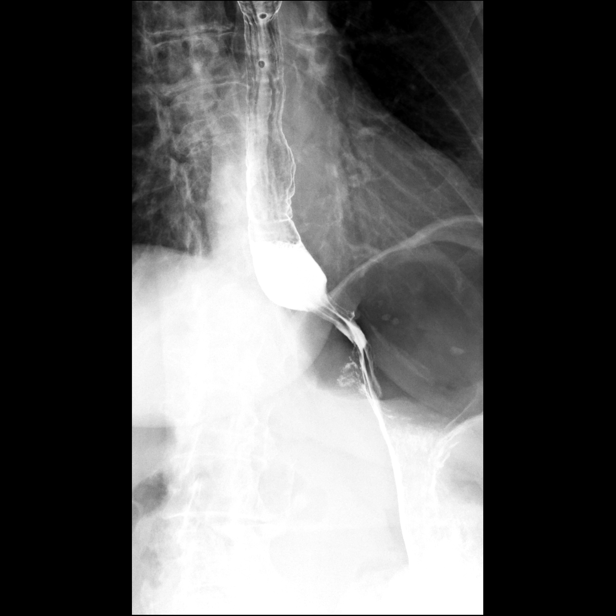

[Series 7: sequence · 1 of 12 frames shown (6 of 11)]
[frame 7/12]
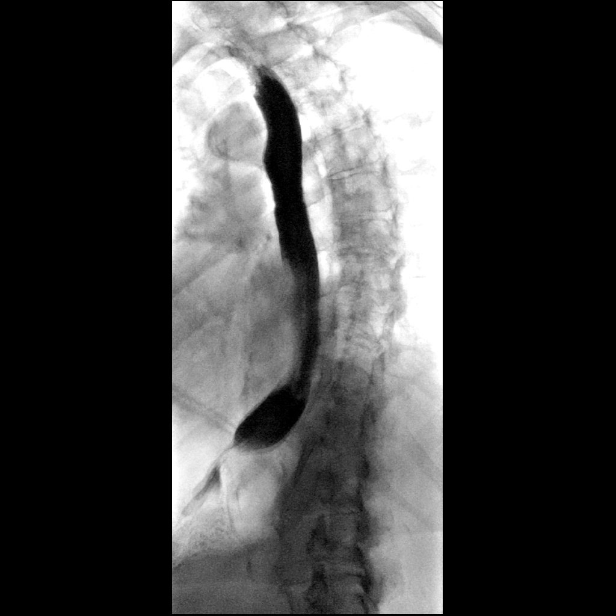

[Series 8: sequence · 1 of 11 frames shown (7 of 11)]
[frame 2/11]
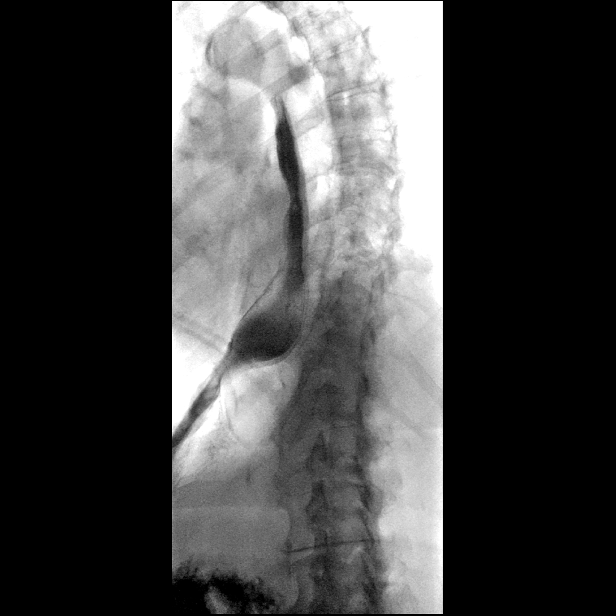

[Series 9: sequence · 2 of 11 frames shown (8 of 11)]
[frame 2/11]
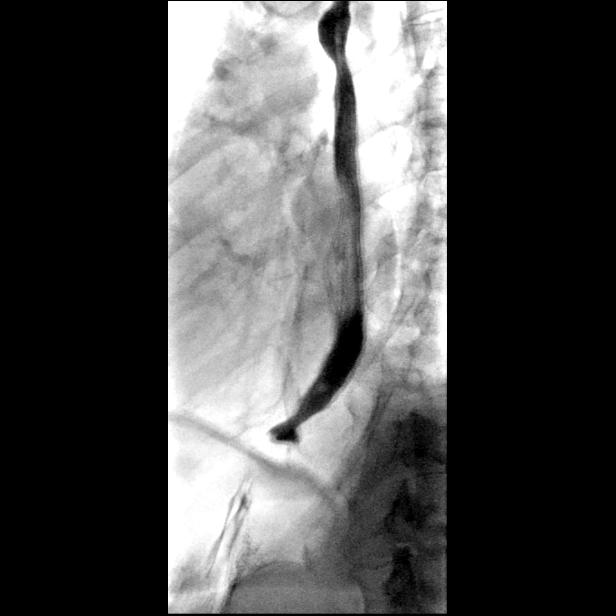
[frame 11/11]
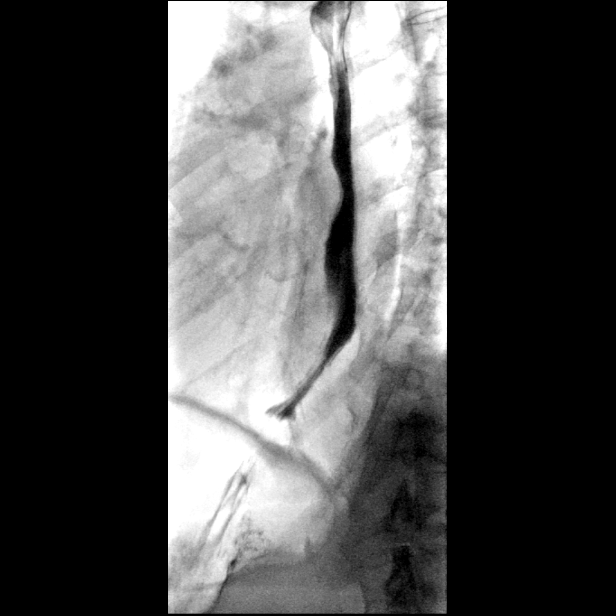

[Series 10: sequence · 1 of 12 frames shown (9 of 11)]
[frame 11/12]
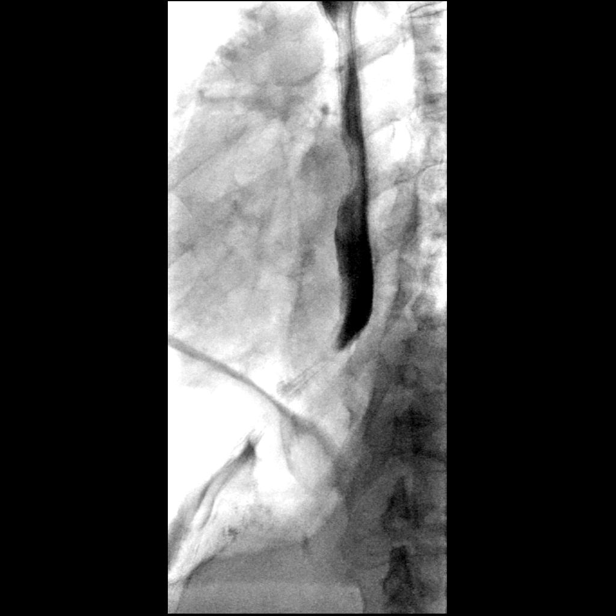

[Series 12: sequence · 1 of 6 frames shown (10 of 11)]
[frame 1/6]
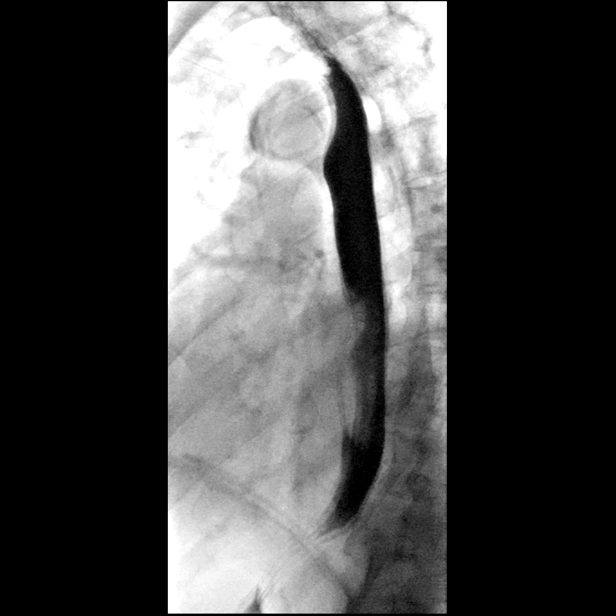

[Series 13: sequence · 1 of 12 frames shown (11 of 11)]
[frame 2/12]
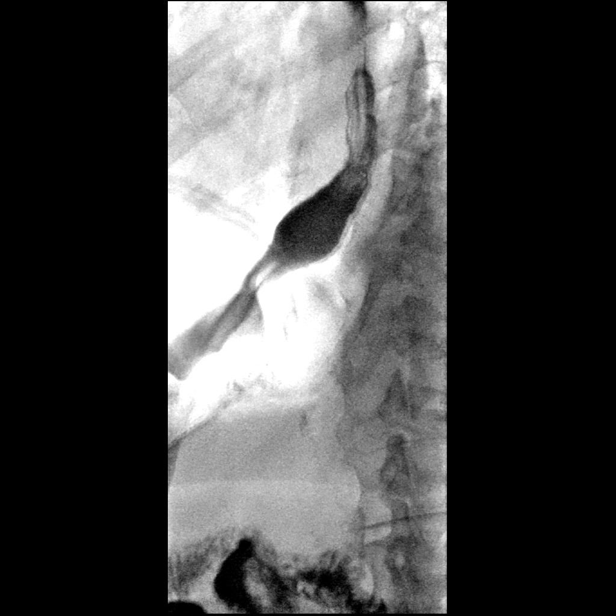

[Series 14: one shot · 1 of 2 slices shown]
[im 2/2]
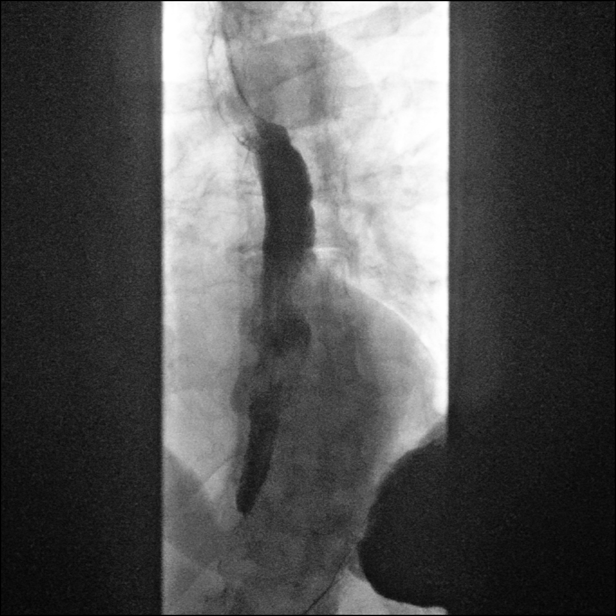

[14 of 24 positions shown; findings below may reference images not displayed]

FINDINGS: Initial barium swallows demonstrate normal pharyngeal motion with
swallowing. No laryngeal penetration or aspiration. No upper
esophageal webs, strictures or diverticuli. There is a very
prominent cricopharyngeal impression but no Ringo diverticulum.
Mild degenerative changes involving the cervical spine with anterior
spurring but no significant mass effect on the posterior cervical
esophagus.

Nonspecific esophageal motility disorder with occasional disruption
of the primary peristaltic wave, occasional tertiary contractions
and occasional esophageal spasm and esophageal stasis when prone.

No hiatal hernia was demonstrated.  No GE reflux.

The 13 mm barium pill passed into the stomach without difficulty.
IMPRESSION: 1. Esophageal dysmotility.
2. No hiatal hernia or GE reflux demonstrated.
3. No esophageal mass or stricture.
4. Very prominent cricopharyngeal impression but no Ringo
diverticulum.

## 2019-06-12 DIAGNOSIS — R32 Unspecified urinary incontinence: Secondary | ICD-10-CM | POA: Diagnosis not present

## 2019-06-12 DIAGNOSIS — Z91048 Other nonmedicinal substance allergy status: Secondary | ICD-10-CM | POA: Diagnosis not present

## 2019-06-12 DIAGNOSIS — Z823 Family history of stroke: Secondary | ICD-10-CM | POA: Diagnosis not present

## 2019-06-12 DIAGNOSIS — Z7722 Contact with and (suspected) exposure to environmental tobacco smoke (acute) (chronic): Secondary | ICD-10-CM | POA: Diagnosis not present

## 2019-06-12 DIAGNOSIS — Z8249 Family history of ischemic heart disease and other diseases of the circulatory system: Secondary | ICD-10-CM | POA: Diagnosis not present

## 2019-06-12 DIAGNOSIS — E039 Hypothyroidism, unspecified: Secondary | ICD-10-CM | POA: Diagnosis not present

## 2019-06-12 DIAGNOSIS — I10 Essential (primary) hypertension: Secondary | ICD-10-CM | POA: Diagnosis not present

## 2019-06-12 DIAGNOSIS — Z85828 Personal history of other malignant neoplasm of skin: Secondary | ICD-10-CM | POA: Diagnosis not present

## 2019-06-12 DIAGNOSIS — M47896 Other spondylosis, lumbar region: Secondary | ICD-10-CM | POA: Diagnosis not present

## 2019-06-12 DIAGNOSIS — M1990 Unspecified osteoarthritis, unspecified site: Secondary | ICD-10-CM | POA: Diagnosis not present

## 2019-07-12 DIAGNOSIS — E782 Mixed hyperlipidemia: Secondary | ICD-10-CM | POA: Diagnosis not present

## 2019-07-12 DIAGNOSIS — I10 Essential (primary) hypertension: Secondary | ICD-10-CM | POA: Diagnosis not present

## 2019-07-12 DIAGNOSIS — R69 Illness, unspecified: Secondary | ICD-10-CM | POA: Diagnosis not present

## 2019-07-13 DIAGNOSIS — M47816 Spondylosis without myelopathy or radiculopathy, lumbar region: Secondary | ICD-10-CM | POA: Diagnosis not present

## 2019-07-13 DIAGNOSIS — M47896 Other spondylosis, lumbar region: Secondary | ICD-10-CM | POA: Diagnosis not present

## 2019-07-25 DIAGNOSIS — M48062 Spinal stenosis, lumbar region with neurogenic claudication: Secondary | ICD-10-CM | POA: Diagnosis not present

## 2019-08-15 DIAGNOSIS — H40013 Open angle with borderline findings, low risk, bilateral: Secondary | ICD-10-CM | POA: Diagnosis not present

## 2019-09-11 DIAGNOSIS — I10 Essential (primary) hypertension: Secondary | ICD-10-CM | POA: Diagnosis not present

## 2019-09-11 DIAGNOSIS — R69 Illness, unspecified: Secondary | ICD-10-CM | POA: Diagnosis not present

## 2019-09-11 DIAGNOSIS — L304 Erythema intertrigo: Secondary | ICD-10-CM | POA: Diagnosis not present

## 2019-09-11 DIAGNOSIS — M1990 Unspecified osteoarthritis, unspecified site: Secondary | ICD-10-CM | POA: Diagnosis not present

## 2019-11-09 DIAGNOSIS — G629 Polyneuropathy, unspecified: Secondary | ICD-10-CM | POA: Diagnosis not present

## 2019-11-09 DIAGNOSIS — R69 Illness, unspecified: Secondary | ICD-10-CM | POA: Diagnosis not present

## 2019-11-09 DIAGNOSIS — I10 Essential (primary) hypertension: Secondary | ICD-10-CM | POA: Diagnosis not present

## 2020-01-11 DIAGNOSIS — Z1389 Encounter for screening for other disorder: Secondary | ICD-10-CM | POA: Diagnosis not present

## 2020-01-11 DIAGNOSIS — E782 Mixed hyperlipidemia: Secondary | ICD-10-CM | POA: Diagnosis not present

## 2020-01-11 DIAGNOSIS — Z0000 Encounter for general adult medical examination without abnormal findings: Secondary | ICD-10-CM | POA: Diagnosis not present

## 2020-01-11 DIAGNOSIS — G629 Polyneuropathy, unspecified: Secondary | ICD-10-CM | POA: Diagnosis not present

## 2020-01-11 DIAGNOSIS — I10 Essential (primary) hypertension: Secondary | ICD-10-CM | POA: Diagnosis not present

## 2020-01-11 DIAGNOSIS — M48061 Spinal stenosis, lumbar region without neurogenic claudication: Secondary | ICD-10-CM | POA: Diagnosis not present

## 2020-01-11 DIAGNOSIS — R69 Illness, unspecified: Secondary | ICD-10-CM | POA: Diagnosis not present

## 2020-01-11 DIAGNOSIS — E039 Hypothyroidism, unspecified: Secondary | ICD-10-CM | POA: Diagnosis not present

## 2020-01-11 DIAGNOSIS — Z23 Encounter for immunization: Secondary | ICD-10-CM | POA: Diagnosis not present

## 2020-02-11 DIAGNOSIS — N390 Urinary tract infection, site not specified: Secondary | ICD-10-CM | POA: Diagnosis not present

## 2020-02-12 DIAGNOSIS — H40013 Open angle with borderline findings, low risk, bilateral: Secondary | ICD-10-CM | POA: Diagnosis not present

## 2020-02-12 DIAGNOSIS — H04123 Dry eye syndrome of bilateral lacrimal glands: Secondary | ICD-10-CM | POA: Diagnosis not present

## 2020-02-12 DIAGNOSIS — Z961 Presence of intraocular lens: Secondary | ICD-10-CM | POA: Diagnosis not present

## 2020-02-12 DIAGNOSIS — H26492 Other secondary cataract, left eye: Secondary | ICD-10-CM | POA: Diagnosis not present

## 2020-02-20 DIAGNOSIS — R69 Illness, unspecified: Secondary | ICD-10-CM | POA: Diagnosis not present

## 2020-02-20 DIAGNOSIS — R3989 Other symptoms and signs involving the genitourinary system: Secondary | ICD-10-CM | POA: Diagnosis not present

## 2020-02-20 DIAGNOSIS — N3001 Acute cystitis with hematuria: Secondary | ICD-10-CM | POA: Diagnosis not present

## 2020-02-20 DIAGNOSIS — R42 Dizziness and giddiness: Secondary | ICD-10-CM | POA: Diagnosis not present

## 2020-02-20 DIAGNOSIS — I10 Essential (primary) hypertension: Secondary | ICD-10-CM | POA: Diagnosis not present

## 2020-02-23 DIAGNOSIS — M4807 Spinal stenosis, lumbosacral region: Secondary | ICD-10-CM | POA: Diagnosis not present

## 2020-02-23 DIAGNOSIS — R202 Paresthesia of skin: Secondary | ICD-10-CM | POA: Diagnosis not present

## 2020-02-23 DIAGNOSIS — R200 Anesthesia of skin: Secondary | ICD-10-CM | POA: Diagnosis not present

## 2020-04-03 DIAGNOSIS — G5613 Other lesions of median nerve, bilateral upper limbs: Secondary | ICD-10-CM | POA: Diagnosis not present

## 2020-04-03 DIAGNOSIS — R69 Illness, unspecified: Secondary | ICD-10-CM | POA: Diagnosis not present

## 2020-04-18 DIAGNOSIS — R69 Illness, unspecified: Secondary | ICD-10-CM | POA: Diagnosis not present

## 2020-04-22 DIAGNOSIS — M48061 Spinal stenosis, lumbar region without neurogenic claudication: Secondary | ICD-10-CM | POA: Diagnosis not present

## 2020-04-22 DIAGNOSIS — G894 Chronic pain syndrome: Secondary | ICD-10-CM | POA: Diagnosis not present

## 2020-04-22 DIAGNOSIS — M170 Bilateral primary osteoarthritis of knee: Secondary | ICD-10-CM | POA: Diagnosis not present

## 2020-04-22 DIAGNOSIS — M47816 Spondylosis without myelopathy or radiculopathy, lumbar region: Secondary | ICD-10-CM | POA: Diagnosis not present

## 2020-04-22 DIAGNOSIS — Z79891 Long term (current) use of opiate analgesic: Secondary | ICD-10-CM | POA: Diagnosis not present

## 2020-04-25 DIAGNOSIS — R69 Illness, unspecified: Secondary | ICD-10-CM | POA: Diagnosis not present

## 2020-04-27 ENCOUNTER — Other Ambulatory Visit: Payer: Self-pay

## 2020-04-27 ENCOUNTER — Encounter (HOSPITAL_COMMUNITY): Payer: Self-pay | Admitting: *Deleted

## 2020-04-27 ENCOUNTER — Emergency Department (HOSPITAL_COMMUNITY)
Admission: EM | Admit: 2020-04-27 | Discharge: 2020-04-27 | Disposition: A | Discharge status: Home / Self Care | Payer: Medicare HMO | Attending: Emergency Medicine | Admitting: Emergency Medicine

## 2020-04-27 DIAGNOSIS — Z79899 Other long term (current) drug therapy: Secondary | ICD-10-CM | POA: Insufficient documentation

## 2020-04-27 DIAGNOSIS — I10 Essential (primary) hypertension: Secondary | ICD-10-CM | POA: Insufficient documentation

## 2020-04-27 DIAGNOSIS — R0982 Postnasal drip: Secondary | ICD-10-CM | POA: Diagnosis not present

## 2020-04-27 DIAGNOSIS — E039 Hypothyroidism, unspecified: Secondary | ICD-10-CM | POA: Insufficient documentation

## 2020-04-27 DIAGNOSIS — R059 Cough, unspecified: Secondary | ICD-10-CM | POA: Diagnosis not present

## 2020-04-27 DIAGNOSIS — Z7982 Long term (current) use of aspirin: Secondary | ICD-10-CM | POA: Insufficient documentation

## 2020-04-27 MED ORDER — FLUTICASONE PROPIONATE 50 MCG/ACT NA SUSP: 2.0000 | Freq: Every day | NASAL | 0 refills | Status: DC

## 2020-04-27 NOTE — ED Provider Notes (Signed)
[facility]-EMERGENCY DEPT Provider Note   CSN: [id number] Arrival date & time: 04/27/20  1755     History Chief Complaint  Patient presents with  . sinus drainage    Shelia Jimenez is a 86 y.o. female history of high cholesterol, hypertension, here presenting with sinus drainage.  Patient has been having sinus drainage for the last several weeks.  She states that it is clear.  Patient states that today she has worsening postnasal drip and had 4 episodes where she has postnasal drip and then some cough and subjective shortness of breath that resolved after couple minutes.  Patient has no fevers or productive cough.  Patient did not receive the Covid vaccines and states that she lives at herself and has no known exposures to Covid.  The history is provided by the patient.       Past Medical History:  Diagnosis Date  . Hypercholesterolemia   . Hypertension   . Hypothyroidism     Patient Active Problem List   Diagnosis Date Noted  . Precordial pain 12/14/2012    Past Surgical History:  Procedure Laterality Date  . CATARACT EXTRACTION    . TONSILLECTOMY AND ADENOIDECTOMY       OB History   No obstetric history on file.     Family History  Problem Relation Age of Onset  . CAD Father 36  . Hypertension Father   . Lung cancer Sister   . Aneurysm Mother        "heart aneurysm"    Social History   Tobacco Use  . Smoking status: Never Smoker  . Smokeless tobacco: Never Used  Substance Use Topics  . Alcohol use: No  . Drug use: Never    Home Medications Prior to Admission medications   Medication Sig Start Date End Date Taking? Authorizing Provider  amLODipine (NORVASC) 2.5 MG tablet TAKE ONE TABLET DAILY 02/02/13   [provider]  aspirin 81 MG tablet Take 81 mg by mouth daily.    [provider]  atorvastatin (LIPITOR) 10 MG tablet Take 1 tablet by mouth daily. 11/14/12   [provider]  Calcium  Carbonate-Vitamin D (CALTRATE 600+D PO) Take by mouth 2 (two) times daily.    [provider]  fish oil-omega-3 fatty acids 1000 MG capsule Take 2 g by mouth daily.    [provider]  levothyroxine (SYNTHROID, LEVOTHROID) 50 MCG tablet Take 50 mcg by mouth daily before breakfast.    [provider]  losartan-hydrochlorothiazide (HYZAAR) 100-25 MG per tablet Take 1 tablet by mouth daily. 11/24/12   [provider]  Multiple Vitamins-Calcium (ONE-A-DAY WOMENS PO) Take by mouth daily.    [provider]  nitroGLYCERIN (NITROSTAT) 0.4 MG SL tablet Place 1 tablet (0.4 mg total) under the tongue every 5 (five) minutes as needed for chest pain. 12/14/12   Rollene Rotunda, MD  polycarbophil (FIBERCON) 625 MG tablet Take 625 mg by mouth 2 (two) times daily.    [provider]    Allergies    Ace inhibitors, Amoxicillin, Celebrex [celecoxib], Crestor [rosuvastatin], Iodine, and Statins  Review of Systems   Review of Systems  HENT: Positive for postnasal drip.   Respiratory: Positive for cough.   All other systems reviewed and are negative.   Physical Exam Updated Vital Signs BP (!) 172/85 (BP Location: Left Arm)   Pulse 89   Temp 98.3 F (36.8 C) (Oral)   Resp 17   Ht 5' 4.5" (1.638  m)   Wt 64.4 kg   SpO2 99%   BMI 24.00 kg/m   Physical Exam Vitals and nursing note reviewed.  HENT:     Head: Normocephalic.     Nose: Nose normal.     Comments: No sinus tenderness    Mouth/Throat:     Mouth: Mucous membranes are moist.  Eyes:     Extraocular Movements: Extraocular movements intact.     Pupils: Pupils are equal, round, and reactive to light.  Cardiovascular:     Rate and Rhythm: Normal rate and regular rhythm.     Pulses: Normal pulses.     Heart sounds: Normal heart sounds.  Pulmonary:     Effort: Pulmonary effort is normal.     Breath sounds: Normal breath sounds.  Abdominal:     General: Abdomen is flat.     Palpations:  Abdomen is soft.  Musculoskeletal:        General: Normal range of motion.     Cervical back: Normal range of motion.  Skin:    General: Skin is warm.     Capillary Refill: Capillary refill takes less than 2 seconds.  Neurological:     General: No focal deficit present.     Mental Status: She is alert and oriented to person, place, and time.  Psychiatric:        Mood and Affect: Mood normal.        Behavior: Behavior normal.     ED Results / Procedures / Treatments   Labs (all labs ordered are listed, but only abnormal results are displayed) Labs Reviewed - No data to display  EKG None  Radiology No results found.  Procedures Procedures (including critical care time)  Medications Ordered in ED Medications - No data to display  ED Course  I have reviewed the triage vital signs and the nursing notes.  Pertinent labs & imaging results that were available during my care of the patient were reviewed by me and considered in my medical decision making (see chart for details).    MDM Rules/Calculators/A&P                         Shelia Jimenez is a 86 y.o. female here presenting with cough and sinus drainage.  No sinus tenderness and drainage is clear.  Patient is afebrile and oxygen saturations normal.  Patient did not receive COVID vaccine but she denies any exposure to Covid and her symptoms going on for weeks.  I think symptoms likely from postnasal drip.  Her lungs are clear right now.  I offered Covid testing and chest x-ray but she declined.  Will discharge home with Flonase and ENT follow-up and I told her if she has worsening shortness of breath she needs to return to the ER.  Final Clinical Impression(s) / ED Diagnoses Final diagnoses:  None    Rx / DC Orders ED Discharge Orders    None       Charlynne Pander, MD 04/27/20 [phone]

## 2020-04-27 NOTE — ED Triage Notes (Signed)
Pt states she has sinus drainage which has become worse. Clear drainage noted

## 2020-04-27 NOTE — Discharge Instructions (Signed)
Use flonase daily   See ENT for follow up   Return to ER if you have worse trouble breathing, sinus congestion, shortness of breath, fever.

## 2020-05-02 DIAGNOSIS — G603 Idiopathic progressive neuropathy: Secondary | ICD-10-CM | POA: Insufficient documentation

## 2020-05-02 DIAGNOSIS — G5603 Carpal tunnel syndrome, bilateral upper limbs: Secondary | ICD-10-CM | POA: Insufficient documentation

## 2020-05-02 DIAGNOSIS — R69 Illness, unspecified: Secondary | ICD-10-CM | POA: Diagnosis not present

## 2020-05-03 DIAGNOSIS — G603 Idiopathic progressive neuropathy: Secondary | ICD-10-CM | POA: Diagnosis not present

## 2020-05-03 DIAGNOSIS — G5603 Carpal tunnel syndrome, bilateral upper limbs: Secondary | ICD-10-CM | POA: Diagnosis not present

## 2020-05-07 DIAGNOSIS — R69 Illness, unspecified: Secondary | ICD-10-CM | POA: Diagnosis not present

## 2020-05-08 DIAGNOSIS — I10 Essential (primary) hypertension: Secondary | ICD-10-CM | POA: Diagnosis not present

## 2020-05-08 DIAGNOSIS — R350 Frequency of micturition: Secondary | ICD-10-CM | POA: Diagnosis not present

## 2020-05-08 DIAGNOSIS — G5603 Carpal tunnel syndrome, bilateral upper limbs: Secondary | ICD-10-CM | POA: Diagnosis not present

## 2020-05-22 DIAGNOSIS — M48061 Spinal stenosis, lumbar region without neurogenic claudication: Secondary | ICD-10-CM | POA: Diagnosis not present

## 2020-05-22 DIAGNOSIS — M170 Bilateral primary osteoarthritis of knee: Secondary | ICD-10-CM | POA: Diagnosis not present

## 2020-05-22 DIAGNOSIS — M47816 Spondylosis without myelopathy or radiculopathy, lumbar region: Secondary | ICD-10-CM | POA: Diagnosis not present

## 2020-05-22 DIAGNOSIS — G894 Chronic pain syndrome: Secondary | ICD-10-CM | POA: Diagnosis not present

## 2020-05-24 DIAGNOSIS — R350 Frequency of micturition: Secondary | ICD-10-CM | POA: Diagnosis not present

## 2020-05-29 DIAGNOSIS — G5603 Carpal tunnel syndrome, bilateral upper limbs: Secondary | ICD-10-CM | POA: Diagnosis not present

## 2020-06-18 DIAGNOSIS — G5600 Carpal tunnel syndrome, unspecified upper limb: Secondary | ICD-10-CM | POA: Diagnosis not present

## 2020-08-22 DIAGNOSIS — E039 Hypothyroidism, unspecified: Secondary | ICD-10-CM | POA: Diagnosis not present

## 2020-08-22 DIAGNOSIS — E782 Mixed hyperlipidemia: Secondary | ICD-10-CM | POA: Diagnosis not present

## 2020-08-22 DIAGNOSIS — M48061 Spinal stenosis, lumbar region without neurogenic claudication: Secondary | ICD-10-CM | POA: Diagnosis not present

## 2020-08-22 DIAGNOSIS — M20002 Unspecified deformity of left finger(s): Secondary | ICD-10-CM | POA: Diagnosis not present

## 2020-08-22 DIAGNOSIS — I10 Essential (primary) hypertension: Secondary | ICD-10-CM | POA: Diagnosis not present

## 2020-08-28 DIAGNOSIS — M47816 Spondylosis without myelopathy or radiculopathy, lumbar region: Secondary | ICD-10-CM | POA: Diagnosis not present

## 2020-08-28 DIAGNOSIS — G894 Chronic pain syndrome: Secondary | ICD-10-CM | POA: Diagnosis not present

## 2020-08-28 DIAGNOSIS — M48061 Spinal stenosis, lumbar region without neurogenic claudication: Secondary | ICD-10-CM | POA: Diagnosis not present

## 2020-08-28 DIAGNOSIS — M170 Bilateral primary osteoarthritis of knee: Secondary | ICD-10-CM | POA: Diagnosis not present

## 2020-09-12 DIAGNOSIS — M13841 Other specified arthritis, right hand: Secondary | ICD-10-CM | POA: Diagnosis not present

## 2020-09-12 DIAGNOSIS — G5603 Carpal tunnel syndrome, bilateral upper limbs: Secondary | ICD-10-CM | POA: Diagnosis not present

## 2020-09-12 DIAGNOSIS — M13842 Other specified arthritis, left hand: Secondary | ICD-10-CM | POA: Diagnosis not present

## 2020-09-30 DIAGNOSIS — M47816 Spondylosis without myelopathy or radiculopathy, lumbar region: Secondary | ICD-10-CM | POA: Diagnosis not present

## 2020-09-30 DIAGNOSIS — G894 Chronic pain syndrome: Secondary | ICD-10-CM | POA: Diagnosis not present

## 2020-09-30 DIAGNOSIS — M170 Bilateral primary osteoarthritis of knee: Secondary | ICD-10-CM | POA: Diagnosis not present

## 2020-09-30 DIAGNOSIS — M48061 Spinal stenosis, lumbar region without neurogenic claudication: Secondary | ICD-10-CM | POA: Diagnosis not present

## 2020-10-03 DIAGNOSIS — G5602 Carpal tunnel syndrome, left upper limb: Secondary | ICD-10-CM | POA: Diagnosis not present

## 2020-10-15 DIAGNOSIS — Z4789 Encounter for other orthopedic aftercare: Secondary | ICD-10-CM | POA: Diagnosis not present

## 2020-10-15 DIAGNOSIS — G5601 Carpal tunnel syndrome, right upper limb: Secondary | ICD-10-CM | POA: Diagnosis not present

## 2020-10-15 DIAGNOSIS — G5602 Carpal tunnel syndrome, left upper limb: Secondary | ICD-10-CM | POA: Diagnosis not present

## 2020-10-29 DIAGNOSIS — M170 Bilateral primary osteoarthritis of knee: Secondary | ICD-10-CM | POA: Diagnosis not present

## 2020-10-29 DIAGNOSIS — M47816 Spondylosis without myelopathy or radiculopathy, lumbar region: Secondary | ICD-10-CM | POA: Diagnosis not present

## 2020-10-29 DIAGNOSIS — M48061 Spinal stenosis, lumbar region without neurogenic claudication: Secondary | ICD-10-CM | POA: Diagnosis not present

## 2020-10-29 DIAGNOSIS — G894 Chronic pain syndrome: Secondary | ICD-10-CM | POA: Diagnosis not present

## 2020-10-29 DIAGNOSIS — Z79891 Long term (current) use of opiate analgesic: Secondary | ICD-10-CM | POA: Diagnosis not present

## 2020-10-31 DIAGNOSIS — G5601 Carpal tunnel syndrome, right upper limb: Secondary | ICD-10-CM | POA: Diagnosis not present

## 2020-11-12 DIAGNOSIS — G5601 Carpal tunnel syndrome, right upper limb: Secondary | ICD-10-CM | POA: Diagnosis not present

## 2020-12-16 DIAGNOSIS — M170 Bilateral primary osteoarthritis of knee: Secondary | ICD-10-CM | POA: Diagnosis not present

## 2020-12-16 DIAGNOSIS — G894 Chronic pain syndrome: Secondary | ICD-10-CM | POA: Diagnosis not present

## 2020-12-16 DIAGNOSIS — M47816 Spondylosis without myelopathy or radiculopathy, lumbar region: Secondary | ICD-10-CM | POA: Diagnosis not present

## 2021-01-15 DIAGNOSIS — G894 Chronic pain syndrome: Secondary | ICD-10-CM | POA: Diagnosis not present

## 2021-01-15 DIAGNOSIS — M170 Bilateral primary osteoarthritis of knee: Secondary | ICD-10-CM | POA: Diagnosis not present

## 2021-01-15 DIAGNOSIS — M47816 Spondylosis without myelopathy or radiculopathy, lumbar region: Secondary | ICD-10-CM | POA: Diagnosis not present

## 2021-01-23 DIAGNOSIS — Z0000 Encounter for general adult medical examination without abnormal findings: Secondary | ICD-10-CM | POA: Diagnosis not present

## 2021-01-23 DIAGNOSIS — E039 Hypothyroidism, unspecified: Secondary | ICD-10-CM | POA: Diagnosis not present

## 2021-01-23 DIAGNOSIS — M48061 Spinal stenosis, lumbar region without neurogenic claudication: Secondary | ICD-10-CM | POA: Diagnosis not present

## 2021-01-23 DIAGNOSIS — Z1389 Encounter for screening for other disorder: Secondary | ICD-10-CM | POA: Diagnosis not present

## 2021-01-23 DIAGNOSIS — I10 Essential (primary) hypertension: Secondary | ICD-10-CM | POA: Diagnosis not present

## 2021-01-23 DIAGNOSIS — R69 Illness, unspecified: Secondary | ICD-10-CM | POA: Diagnosis not present

## 2021-01-23 DIAGNOSIS — E782 Mixed hyperlipidemia: Secondary | ICD-10-CM | POA: Diagnosis not present

## 2021-02-03 DIAGNOSIS — E871 Hypo-osmolality and hyponatremia: Secondary | ICD-10-CM | POA: Diagnosis not present

## 2021-02-11 DIAGNOSIS — G5603 Carpal tunnel syndrome, bilateral upper limbs: Secondary | ICD-10-CM | POA: Diagnosis not present

## 2021-02-12 DIAGNOSIS — G894 Chronic pain syndrome: Secondary | ICD-10-CM | POA: Diagnosis not present

## 2021-02-12 DIAGNOSIS — M47816 Spondylosis without myelopathy or radiculopathy, lumbar region: Secondary | ICD-10-CM | POA: Diagnosis not present

## 2021-02-12 DIAGNOSIS — M48061 Spinal stenosis, lumbar region without neurogenic claudication: Secondary | ICD-10-CM | POA: Diagnosis not present

## 2021-02-12 DIAGNOSIS — M170 Bilateral primary osteoarthritis of knee: Secondary | ICD-10-CM | POA: Diagnosis not present

## 2021-03-12 DIAGNOSIS — M170 Bilateral primary osteoarthritis of knee: Secondary | ICD-10-CM | POA: Diagnosis not present

## 2021-03-12 DIAGNOSIS — G894 Chronic pain syndrome: Secondary | ICD-10-CM | POA: Diagnosis not present

## 2021-03-12 DIAGNOSIS — M47816 Spondylosis without myelopathy or radiculopathy, lumbar region: Secondary | ICD-10-CM | POA: Diagnosis not present

## 2021-04-08 DIAGNOSIS — S60414A Abrasion of right ring finger, initial encounter: Secondary | ICD-10-CM | POA: Diagnosis not present

## 2021-04-08 DIAGNOSIS — G5603 Carpal tunnel syndrome, bilateral upper limbs: Secondary | ICD-10-CM | POA: Diagnosis not present

## 2021-04-10 DIAGNOSIS — M47816 Spondylosis without myelopathy or radiculopathy, lumbar region: Secondary | ICD-10-CM | POA: Diagnosis not present

## 2021-04-10 DIAGNOSIS — G894 Chronic pain syndrome: Secondary | ICD-10-CM | POA: Diagnosis not present

## 2021-04-10 DIAGNOSIS — M48061 Spinal stenosis, lumbar region without neurogenic claudication: Secondary | ICD-10-CM | POA: Diagnosis not present

## 2021-04-10 DIAGNOSIS — M170 Bilateral primary osteoarthritis of knee: Secondary | ICD-10-CM | POA: Diagnosis not present

## 2021-04-22 DIAGNOSIS — H04123 Dry eye syndrome of bilateral lacrimal glands: Secondary | ICD-10-CM | POA: Diagnosis not present

## 2021-04-22 DIAGNOSIS — H40013 Open angle with borderline findings, low risk, bilateral: Secondary | ICD-10-CM | POA: Diagnosis not present

## 2021-04-22 DIAGNOSIS — Z961 Presence of intraocular lens: Secondary | ICD-10-CM | POA: Diagnosis not present

## 2021-04-22 DIAGNOSIS — H26492 Other secondary cataract, left eye: Secondary | ICD-10-CM | POA: Diagnosis not present

## 2021-06-05 DIAGNOSIS — G894 Chronic pain syndrome: Secondary | ICD-10-CM | POA: Diagnosis not present

## 2021-06-05 DIAGNOSIS — M48061 Spinal stenosis, lumbar region without neurogenic claudication: Secondary | ICD-10-CM | POA: Diagnosis not present

## 2021-06-05 DIAGNOSIS — M47816 Spondylosis without myelopathy or radiculopathy, lumbar region: Secondary | ICD-10-CM | POA: Diagnosis not present

## 2021-06-05 DIAGNOSIS — M170 Bilateral primary osteoarthritis of knee: Secondary | ICD-10-CM | POA: Diagnosis not present

## 2021-06-05 DIAGNOSIS — Z79891 Long term (current) use of opiate analgesic: Secondary | ICD-10-CM | POA: Diagnosis not present

## 2021-07-08 DIAGNOSIS — M47816 Spondylosis without myelopathy or radiculopathy, lumbar region: Secondary | ICD-10-CM | POA: Diagnosis not present

## 2021-07-08 DIAGNOSIS — M170 Bilateral primary osteoarthritis of knee: Secondary | ICD-10-CM | POA: Diagnosis not present

## 2021-07-08 DIAGNOSIS — M48061 Spinal stenosis, lumbar region without neurogenic claudication: Secondary | ICD-10-CM | POA: Diagnosis not present

## 2021-07-08 DIAGNOSIS — G894 Chronic pain syndrome: Secondary | ICD-10-CM | POA: Diagnosis not present

## 2021-07-31 DIAGNOSIS — M170 Bilateral primary osteoarthritis of knee: Secondary | ICD-10-CM | POA: Diagnosis not present

## 2021-08-04 DIAGNOSIS — I10 Essential (primary) hypertension: Secondary | ICD-10-CM | POA: Diagnosis not present

## 2021-08-04 DIAGNOSIS — M48061 Spinal stenosis, lumbar region without neurogenic claudication: Secondary | ICD-10-CM | POA: Diagnosis not present

## 2021-08-04 DIAGNOSIS — E782 Mixed hyperlipidemia: Secondary | ICD-10-CM | POA: Diagnosis not present

## 2021-08-04 DIAGNOSIS — E039 Hypothyroidism, unspecified: Secondary | ICD-10-CM | POA: Diagnosis not present

## 2021-08-14 DIAGNOSIS — M170 Bilateral primary osteoarthritis of knee: Secondary | ICD-10-CM | POA: Diagnosis not present

## 2021-08-21 DIAGNOSIS — M170 Bilateral primary osteoarthritis of knee: Secondary | ICD-10-CM | POA: Diagnosis not present

## 2021-09-03 DIAGNOSIS — M48061 Spinal stenosis, lumbar region without neurogenic claudication: Secondary | ICD-10-CM | POA: Diagnosis not present

## 2021-09-03 DIAGNOSIS — M47816 Spondylosis without myelopathy or radiculopathy, lumbar region: Secondary | ICD-10-CM | POA: Diagnosis not present

## 2021-09-03 DIAGNOSIS — M170 Bilateral primary osteoarthritis of knee: Secondary | ICD-10-CM | POA: Diagnosis not present

## 2021-09-03 DIAGNOSIS — G894 Chronic pain syndrome: Secondary | ICD-10-CM | POA: Diagnosis not present

## 2021-10-15 DIAGNOSIS — G8929 Other chronic pain: Secondary | ICD-10-CM | POA: Diagnosis not present

## 2021-10-15 DIAGNOSIS — M5136 Other intervertebral disc degeneration, lumbar region: Secondary | ICD-10-CM | POA: Diagnosis not present

## 2021-10-15 DIAGNOSIS — M1990 Unspecified osteoarthritis, unspecified site: Secondary | ICD-10-CM | POA: Diagnosis not present

## 2021-10-15 DIAGNOSIS — M48061 Spinal stenosis, lumbar region without neurogenic claudication: Secondary | ICD-10-CM | POA: Diagnosis not present

## 2021-10-27 DIAGNOSIS — M170 Bilateral primary osteoarthritis of knee: Secondary | ICD-10-CM | POA: Diagnosis not present

## 2021-10-27 DIAGNOSIS — M47816 Spondylosis without myelopathy or radiculopathy, lumbar region: Secondary | ICD-10-CM | POA: Diagnosis not present

## 2021-10-27 DIAGNOSIS — M48061 Spinal stenosis, lumbar region without neurogenic claudication: Secondary | ICD-10-CM | POA: Diagnosis not present

## 2021-10-27 DIAGNOSIS — G894 Chronic pain syndrome: Secondary | ICD-10-CM | POA: Diagnosis not present

## 2021-10-27 DIAGNOSIS — Z79891 Long term (current) use of opiate analgesic: Secondary | ICD-10-CM | POA: Diagnosis not present

## 2021-11-28 DIAGNOSIS — M47816 Spondylosis without myelopathy or radiculopathy, lumbar region: Secondary | ICD-10-CM | POA: Diagnosis not present

## 2021-11-28 DIAGNOSIS — G894 Chronic pain syndrome: Secondary | ICD-10-CM | POA: Diagnosis not present

## 2021-11-28 DIAGNOSIS — M48061 Spinal stenosis, lumbar region without neurogenic claudication: Secondary | ICD-10-CM | POA: Diagnosis not present

## 2021-11-28 DIAGNOSIS — M170 Bilateral primary osteoarthritis of knee: Secondary | ICD-10-CM | POA: Diagnosis not present

## 2021-12-05 NOTE — Therapy (Signed)
OUTPATIENT PHYSICAL THERAPY THORACOLUMBAR EVALUATION   Patient Name: Shelia Jimenez MRN: [id number] DOB:[birth year], 88 y.o., female Today's Date: 12/05/2021    Past Medical History:  Diagnosis Date   Hypercholesterolemia    Hypertension    Hypothyroidism    Past Surgical History:  Procedure Laterality Date   CATARACT EXTRACTION     TONSILLECTOMY AND ADENOIDECTOMY     Patient Active Problem List   Diagnosis Date Noted   Precordial pain 12/14/2012    PCP: Blane Ohara, MD  REFERRING PROVIDER: Verdon Cummins, MD  REFERRING DIAG:  M43.06 (ICD-10-CM) - Lumbar spondylolysis  M17.9 (ICD-10-CM) - OA (osteoarthritis) of knee    Rationale for Evaluation and Treatment Rehabilitation  THERAPY DIAG:  No diagnosis found.  ONSET DATE: chronic   SUBJECTIVE:                                                                                                                                                                                           SUBJECTIVE STATEMENT: Pt is an 88 y.o. female who presents to PT with chronic LBP and bil knee pain.  Pt has had epidural without relief of symptoms PERTINENT HISTORY:  Spinal stenosis, bil knee OA, carpal tunnel  PAIN:  Are you having pain? Yes: NPRS scale: ***/10 Pain location: *** Pain description: *** Aggravating factors: *** Relieving factors: ***   PRECAUTIONS: {Therapy precautions:24002}  WEIGHT BEARING RESTRICTIONS {Yes ***/No:24003}  FALLS:  Has patient fallen in last 6 months? {fallsyesno:27318}  LIVING ENVIRONMENT: Lives with: {OPRC lives with:25569::"lives with their family"} Lives in: {Lives in:25570} Stairs: {opstairs:27293} Has following equipment at home: {Assistive devices:23999}  OCCUPATION: retired    PLOF: {PLOF:24004}  PATIENT GOALS ***   OBJECTIVE:   DIAGNOSTIC FINDINGS:  ***  PATIENT SURVEYS:  FOTO ***  SCREENING FOR RED FLAGS: Bowel or bladder incontinence: {Yes/No:304960894} Spinal  tumors: {Yes/No:304960894} Cauda equina syndrome: {Yes/No:304960894} Compression fracture: {Yes/No:304960894} Abdominal aneurysm: {Yes/No:304960894}  COGNITION:  Overall cognitive status: {cognition:24006}     SENSATION: {sensation:27233}  MUSCLE LENGTH: Hamstrings: Right *** deg; Left *** deg Thomas test: Right *** deg; Left *** deg  POSTURE: {posture:25561}  PALPATION: ***  LUMBAR ROM:   {AROM/PROM:27142}  A/PROM  eval  Flexion   Extension   Right lateral flexion   Left lateral flexion   Right rotation   Left rotation    (Blank rows = not tested)  LOWER EXTREMITY ROM:     {AROM/PROM:27142}  Right eval Left eval  Hip flexion    Hip extension    Hip abduction    Hip adduction    Hip internal rotation    Hip external rotation  Knee flexion    Knee extension    Ankle dorsiflexion    Ankle plantarflexion    Ankle inversion    Ankle eversion     (Blank rows = not tested)  LOWER EXTREMITY MMT:    MMT Right eval Left eval  Hip flexion    Hip extension    Hip abduction    Hip adduction    Hip internal rotation    Hip external rotation    Knee flexion    Knee extension    Ankle dorsiflexion    Ankle plantarflexion    Ankle inversion    Ankle eversion     (Blank rows = not tested)  LUMBAR SPECIAL TESTS:  {lumbar special test:25242}  FUNCTIONAL TESTS:  {Functional tests:24029}  GAIT: Distance walked: *** Assistive device utilized: {Assistive devices:23999} Level of assistance: {Levels of assistance:24026} Comments: ***    TODAY'S TREATMENT  Date: 12/08/21 HEP established-see below        PATIENT EDUCATION:  Education details:  Person educated: Patient Education method: Programmer, multimedia, Facilities manager, and Handouts Education comprehension: verbalized understanding and returned demonstration   HOME EXERCISE PROGRAM: ***  ASSESSMENT:  CLINICAL IMPRESSION: Patient is a 88 y.o. female who was seen today for physical therapy  evaluation and treatment for LBP and bil knee pain of a chronic nature..    OBJECTIVE IMPAIRMENTS {opptimpairments:25111}.   ACTIVITY LIMITATIONS {activitylimitations:27494}  PARTICIPATION LIMITATIONS: {participationrestrictions:25113}  PERSONAL FACTORS {Personal factors:25162} are also affecting patient's functional outcome.   REHAB POTENTIAL: Good  CLINICAL DECISION MAKING: Evolving/moderate complexity  EVALUATION COMPLEXITY: Moderate   GOALS: Goals reviewed with patient? Yes  SHORT TERM GOALS: Target date: 01/05/22  Be independent in initial HEP Baseline: Goal status: INITIAL  2.  *** Baseline:  Goal status: {GOALSTATUS:25110}  3.  *** Baseline:  Goal status: {GOALSTATUS:25110}  4.  *** Baseline:  Goal status: {GOALSTATUS:25110}    LONG TERM GOALS: Target date: 02/02/22  Be independent in advanced HEP Baseline:  Goal status: INITIAL  2.  Increase FOTO to > or = to  Baseline:  Goal status: INITIAL  3.  Verbalize and demonstrate body mechanics modifications for lumbar and knee protection with functional tasks  Baseline:  Goal status: INITIAL  4.  *** Baseline:  Goal status: {GOALSTATUS:25110}  5.  *** Baseline:  Goal status: {GOALSTATUS:25110}     PLAN: PT FREQUENCY: 2x/week  PT DURATION: 8 weeks  PLANNED INTERVENTIONS: Therapeutic exercises, Therapeutic activity, Neuromuscular re-education, Balance training, Gait training, Patient/Family education, Joint mobilization, Stair training, Aquatic Therapy, Dry Needling, Electrical stimulation, Spinal manipulation, Spinal mobilization, Cryotherapy, Moist heat, Taping, and Manual therapy.  PLAN FOR NEXT SESSION: review HEP  Lorrene Reid, PT 12/05/21 12:55 PM  [facility] [address] [place], [place] [address] Phone # [phone] Fax [phone]

## 2021-12-08 ENCOUNTER — Ambulatory Visit: Payer: Medicare HMO | Attending: Physical Medicine and Rehabilitation

## 2021-12-08 DIAGNOSIS — M79622 Pain in left upper arm: Secondary | ICD-10-CM | POA: Diagnosis not present

## 2021-12-08 DIAGNOSIS — R2689 Other abnormalities of gait and mobility: Secondary | ICD-10-CM | POA: Diagnosis not present

## 2021-12-08 DIAGNOSIS — M79601 Pain in right arm: Secondary | ICD-10-CM

## 2021-12-08 DIAGNOSIS — M25561 Pain in right knee: Secondary | ICD-10-CM | POA: Insufficient documentation

## 2021-12-08 DIAGNOSIS — M25562 Pain in left knee: Secondary | ICD-10-CM | POA: Diagnosis not present

## 2021-12-08 DIAGNOSIS — G8929 Other chronic pain: Secondary | ICD-10-CM | POA: Diagnosis not present

## 2021-12-08 DIAGNOSIS — M5459 Other low back pain: Secondary | ICD-10-CM

## 2021-12-08 DIAGNOSIS — M6281 Muscle weakness (generalized): Secondary | ICD-10-CM

## 2021-12-08 NOTE — Patient Instructions (Signed)
     [facility] Welcome to [facility]! Here you will find all the information you will need regarding your pool therapy. If you have further questions at any time, please call our office at [phone]. After completing your initial evaluation in the [facility], you may be eligible to complete a portion of your therapy in the pool. A typical week of therapy will consist of 1-2 typical physical therapy visits at our [facility] location and an additional session of therapy in the pool located at the [facility] [address]. [address], [place] [address]. The phone number at the pool site is [phone]. Please call this number if you are running late or need to cancel your appointment.  Aquatic therapy will be offered on Wednesday mornings and Friday afternoons. Each session will last approximately 45 minutes. All scheduling and payments for aquatic therapy sessions, including cancelations, will be done through our [facility] location.  To be eligible for aquatic therapy, these criteria must be met: You must be able to independently change in the locker room and get to the pool deck. A caregiver can come with you to help if needed. There are benches for a caregiver to sit on next to the pool. No one with an open wound is permitted in the pool.  Handicap parking is available in the front and there is a drop off option for even closer accessibility. Please arrive 15 minutes prior to your appointment to prepare for your pool session. You must sign in at the front desk upon your arrival. Please be sure to attend to any toileting needs prior to entering the pool. Locker rooms for changing are available.  There is direct access to the pool deck from the locker room. You can lock your belongings in a locker or bring them with you poolside. Your therapist will greet you on the pool deck. There may be other swimmers in the pool at the  same time but your session is one-on-one with the therapist.  [facility] [address] [facility] [address], [address] [place], [place] [address] Phone # [phone] Fax [phone]  

## 2021-12-17 ENCOUNTER — Encounter (HOSPITAL_BASED_OUTPATIENT_CLINIC_OR_DEPARTMENT_OTHER): Payer: Self-pay | Admitting: Physical Therapy

## 2021-12-17 ENCOUNTER — Ambulatory Visit (HOSPITAL_BASED_OUTPATIENT_CLINIC_OR_DEPARTMENT_OTHER): Payer: Medicare HMO | Attending: Physical Medicine and Rehabilitation | Admitting: Physical Therapy

## 2021-12-17 DIAGNOSIS — R2689 Other abnormalities of gait and mobility: Secondary | ICD-10-CM | POA: Diagnosis not present

## 2021-12-17 DIAGNOSIS — G8929 Other chronic pain: Secondary | ICD-10-CM | POA: Diagnosis not present

## 2021-12-17 DIAGNOSIS — M25561 Pain in right knee: Secondary | ICD-10-CM | POA: Diagnosis not present

## 2021-12-17 DIAGNOSIS — M5459 Other low back pain: Secondary | ICD-10-CM | POA: Diagnosis not present

## 2021-12-17 DIAGNOSIS — M25562 Pain in left knee: Secondary | ICD-10-CM | POA: Diagnosis not present

## 2021-12-17 NOTE — Therapy (Signed)
OUTPATIENT PHYSICAL THERAPY THORACOLUMBAR    Patient Name: Shelia Jimenez MRN: [id number] DOB:June 23, 1933, 88 y.o., female Today's Date: 12/17/2021   PT End of Session - 12/17/21 1549     Visit Number 2    Date for PT Re-Evaluation 02/02/22    Authorization Type [employer]    Progress Note Due on Visit 10    PT Start Time 1416    PT Stop Time 1500    PT Time Calculation (min) 44 min    Activity Tolerance Other (comment);Patient tolerated treatment well   Limited by inability to find center of buoyancy   Behavior During Therapy Anxious             Past Medical History:  Diagnosis Date   Hypercholesterolemia    Hypertension    Hypothyroidism    Past Surgical History:  Procedure Laterality Date   CATARACT EXTRACTION     MENISECTOMY Right 2015   TONSILLECTOMY AND ADENOIDECTOMY     Patient Active Problem List   Diagnosis Date Noted   Precordial pain 12/14/2012    PCP: Blane Ohara, MD  REFERRING PROVIDER: Verdon Cummins, MD  REFERRING DIAG:  M43.06 (ICD-10-CM) - Lumbar spondylolysis  M17.9 (ICD-10-CM) - OA (osteoarthritis) of knee    Rationale for Evaluation and Treatment Rehabilitation  THERAPY DIAG:  Other low back pain  Chronic pain of left knee  Chronic pain of right knee  Other abnormalities of gait and mobility  ONSET DATE: chronic   SUBJECTIVE:                                                                                                                                                                                          Current subjective: "I'm a little nervous about getting in the pool, I have been putting off"    SUBJECTIVE STATEMENT: Pt is an 88 y.o. female who presents to PT with chronic LBP and bil knee pain.  Pt has had spinal epidural without relief of symptoms and synovisc injections into bil knees with minimal relief.  Pt reports widespread pain and has had PT ~2 years ago to improve function.  Pt reports that she is here  because her daughters want her to work on the pain and improve her function. PERTINENT HISTORY:  Spinal stenosis, bil knee OA, carpal tunnel  PAIN:  Are you having pain? Yes: NPRS scale: 8/10 Pain location: bil knees and low back Pain description: sore, intense  Aggravating factors: constant pain, standing/walking Relieving factors: ice, heat, pain medication, sitting down   PRECAUTIONS: Fall  WEIGHT BEARING RESTRICTIONS No  FALLS:  Has patient fallen in last 6 months?  No  LIVING ENVIRONMENT: Lives with: lives alone Lives in: House/apartment Stairs: Yes: External: 3 steps; ramp Has following equipment at home: Single point cane, Walker - 2 wheeled, shower chair, Grab bars, and Ramped entry  OCCUPATION: retired    PLOF: Independent with basic ADLs Pt does short distance grocery shopping Lives at the [facility]   PATIENT GOALS reduce pain, improve function, improve balance    OBJECTIVE:   DIAGNOSTIC FINDINGS:  Spinal stenosis and bil knee OA    SCREENING FOR RED FLAGS: Bowel or bladder incontinence: No Spinal tumors: No Cauda equina syndrome: No Compression fracture: No Abdominal aneurysm: No  COGNITION:  Overall cognitive status: Within functional limits for tasks assessed     SENSATION: WFL  MUSCLE LENGTH: Rt limited by 50%, Lt limited by 25%  POSTURE: rounded shoulders, forward head, and flexed trunk   PALPATION: Diffuse palpable tenderness over bil knees, shoulders and lumbar spine   LUMBAR ROM:   Limited with mobility, not formally tested  Shoulder ROM: limited by 50% with pain in all directions     LOWER EXTREMITY MMT:    MMT Right eval Left eval  Hip flexion 4-/5 4-/5  Hip extension    Hip abduction 4/5 4/5  Hip adduction    Hip internal rotation    Hip external rotation    Knee flexion 4/5 4/5  Knee extension 4/5 4+/5  Ankle dorsiflexion 4+/5 4+/5  Ankle plantarflexion    Ankle inversion    Ankle eversion     (Blank rows = not  tested)  Bil shoulder strength: 4/5  FUNCTIONAL TESTS:  5 times sit to stand: 14.66 with use of hands on chair  Timed up and go (TUG): 18.03 with cane   GAIT: Distance walked: 50 Assistive device utilized: Single point cane Level of assistance: Modified independence Comments: slow mobility, narrow base of support with scissoring, shortened step length     TODAY'S TREATMENT  Date: 12/17/21 Pt seen for aquatic therapy today.  Treatment took place in water 3.25-4.8 ft in depth at the [facility] pool. Temp of water was 91.  Pt entered/exited the pool via stairs step to pattern cga with bilat rail.  Intro to setting -Walking forward x 6 widths, backward x 2, side ways x2 using yellow noodle and min assist of therapist for balance and safety. Eventually went to [facility] which pt finds more comfortable.  Cadence very slowed. Constant cues for weight shifting, step length and encouragement to flex knees.  -Seated on bench with then without blue step under feet. Mod assist to maintain position on bench. Cues for weight shifting forward, increasing weight on feet, scooting to edge of bench unsuccessful in pt gaining indep sitting submerged. - LAQ R/L 2x10 mod assist to remain in seated position.  -Standing ue supported on wall: df; pf; marching; add/abd and hip extension x5-10 as tolerated.   Pt requires buoyancy for support and to offload joints with strengthening exercises. Viscosity of the water is needed for resistance of strengthening; water current perturbations provides challenge to standing balance unsupported, requiring increased core activation.     PATIENT EDUCATION:  Education details: pool info, Code: Acupuncturist Person educated: Patient Education method: Programmer, multimedia, Facilities manager, and Handouts Education comprehension: verbalized understanding and returned demonstration   HOME EXERCISE PROGRAM: Access Code: [email or web address] URL: [email or web address] Date:  12/08/2021 Prepared by: Tresa Endo  Exercises - Sit to Stand  - 2 x daily - 7 x weekly - 2 sets - 5 reps - Seated Hamstring Stretch  -  2 x daily - 7 x weekly - 1 sets - 3 reps - 20 hold - Supine Shoulder Flexion AAROM with Hands Clasped  - 2 x daily - 7 x weekly - 1 sets - 10 reps - 5 hold  ASSESSMENT:  CLINICAL IMPRESSION: Pt requires mod-min assistance of therapist in pool at all times. Pt apprehensive upon initiation of session.  She is unable to maintain standing balance submerged without manual assist and encouragement. Struggled throughout session to find center of buoyancy to allow for improved upright position seated or standing.   Moderate cues given throughout ambulation for weight shifting, pt leaning backward majority of time. Multiple mod LOB that require assistance to recover. Seated she requires mod assist to maintain position on bench. She tolerate session fair.  Did gain good core and LE engagement.  Patient is a 88 y.o. female who was seen today for physical therapy evaluation and treatment for LBP and bil knee pain of a chronic nature. Pt demonstrates functional weakness and balance deficits and reports 8/10 widespread pain.  Pt is limited with standing and walking due to chronic pain and limitations associated with this.  Pt will attend aquatic PT 1x/wk and hopes to be able to transition to an independent program for further mobility advancements.  Patient will benefit from skilled PT to address the below impairments and improve overall function.    OBJECTIVE IMPAIRMENTS Abnormal gait, decreased balance, decreased endurance, decreased mobility, difficulty walking, decreased ROM, impaired flexibility, impaired UE functional use, improper body mechanics, postural dysfunction, and pain.   ACTIVITY LIMITATIONS bending, standing, squatting, stairs, transfers, reach over head, and locomotion level  PARTICIPATION LIMITATIONS: meal prep, cleaning, shopping, and community  activity  PERSONAL FACTORS Age, Time since onset of injury/illness/exacerbation, and 1-2 comorbidities: spinal stenosis, chronic LBP, knee OA  are also affecting patient's functional outcome.   REHAB POTENTIAL: Good  CLINICAL DECISION MAKING: Evolving/moderate complexity  EVALUATION COMPLEXITY: Moderate   GOALS: Goals reviewed with patient? Yes  SHORT TERM GOALS: Target date: 01/05/22  Be independent in initial HEP Baseline: Goal status: INITIAL  2.  Report 25% increased ease with reaching overhead with functional tasks  Baseline: limited Shoulder A/ROM and pain with reaching past 90 Goal status: INITIAL  3.  Improve LE strength to stand from chair with mod UE support  Baseline:  Goal status: INITIAL  4.  Perform TUG in < or = to 15 seconds to improve balance  Baseline:  Goal status: INITIAL    LONG TERM GOALS: Target date: 02/02/22  Be independent in advanced HEP Baseline:  Goal status: INITIAL  2.  Perform sit to stand with min UE support due to improved LE strength  Baseline:  Goal status: INITIAL  3.  Verbalize and demonstrate body mechanics modifications for lumbar and knee protection with functional tasks  Baseline:  Goal status: INITIAL  4.  Perform TUG in < or = to 13 seconds to improve balance  Baseline:  Goal status: INITIAL  5.  Report 50% increased ease with reaching overhead with daily tasks  Baseline:  Goal status: INITIAL   6.  Report a 25% reduction in knee and lumbar pain to improve function   Goal Status: INITIAL     PLAN: PT FREQUENCY: 2x/week  PT DURATION: 8 weeks  PLANNED INTERVENTIONS: Therapeutic exercises, Therapeutic activity, Neuromuscular re-education, Balance training, Gait training, Patient/Family education, Joint mobilization, Stair training, Aquatic Therapy, Dry Needling, Electrical stimulation, Spinal manipulation, Spinal mobilization, Cryotherapy, Moist heat, Taping, and Manual therapy.  PLAN FOR NEXT SESSION: review  HEP, aquatics PT to improve mobility and ability to exercise with joints un weighted.    Corrie Dandy Wenden) Tylicia Sherman MPT 12/17/21 6:31 PM  [facility] [address], [address] [place], [place] [address] Phone # [phone] Fax [phone]

## 2021-12-22 DIAGNOSIS — M170 Bilateral primary osteoarthritis of knee: Secondary | ICD-10-CM | POA: Diagnosis not present

## 2021-12-22 DIAGNOSIS — M47816 Spondylosis without myelopathy or radiculopathy, lumbar region: Secondary | ICD-10-CM | POA: Diagnosis not present

## 2021-12-22 DIAGNOSIS — G894 Chronic pain syndrome: Secondary | ICD-10-CM | POA: Diagnosis not present

## 2021-12-22 DIAGNOSIS — M48061 Spinal stenosis, lumbar region without neurogenic claudication: Secondary | ICD-10-CM | POA: Diagnosis not present

## 2022-01-09 ENCOUNTER — Ambulatory Visit: Payer: Self-pay | Admitting: Physical Therapy

## 2022-01-13 ENCOUNTER — Ambulatory Visit (HOSPITAL_BASED_OUTPATIENT_CLINIC_OR_DEPARTMENT_OTHER): Payer: Medicare HMO | Attending: Physical Medicine and Rehabilitation | Admitting: Physical Therapy

## 2022-01-13 ENCOUNTER — Encounter (HOSPITAL_BASED_OUTPATIENT_CLINIC_OR_DEPARTMENT_OTHER): Payer: Self-pay | Admitting: Physical Therapy

## 2022-01-13 DIAGNOSIS — M5459 Other low back pain: Secondary | ICD-10-CM | POA: Diagnosis not present

## 2022-01-13 DIAGNOSIS — M25562 Pain in left knee: Secondary | ICD-10-CM | POA: Diagnosis not present

## 2022-01-13 DIAGNOSIS — G8929 Other chronic pain: Secondary | ICD-10-CM | POA: Diagnosis not present

## 2022-01-13 DIAGNOSIS — M25561 Pain in right knee: Secondary | ICD-10-CM | POA: Diagnosis not present

## 2022-01-13 NOTE — Therapy (Signed)
OUTPATIENT PHYSICAL THERAPY THORACOLUMBAR    Patient Name: Shelia Jimenez MRN: [id number] DOB:02-24-1933, 88 y.o., female Today's Date: 01/13/2022   PT End of Session - 01/13/22 1113     Visit Number 3    Date for PT Re-Evaluation 02/02/22    Authorization Type [employer]    Progress Note Due on Visit 10    PT Start Time 1115    PT Stop Time 1200    PT Time Calculation (min) 45 min    Activity Tolerance Patient tolerated treatment well   Limited by inability to find center of buoyancy   Behavior During Therapy [facility] for tasks assessed/performed             Past Medical History:  Diagnosis Date   Hypercholesterolemia    Hypertension    Hypothyroidism    Past Surgical History:  Procedure Laterality Date   CATARACT EXTRACTION     MENISECTOMY Right 2015   TONSILLECTOMY AND ADENOIDECTOMY     Patient Active Problem List   Diagnosis Date Noted   Precordial pain 12/14/2012    PCP: Blane Ohara, MD  REFERRING PROVIDER: Verdon Cummins, MD  REFERRING DIAG:  M43.06 (ICD-10-CM) - Lumbar spondylolysis  M17.9 (ICD-10-CM) - OA (osteoarthritis) of knee    Rationale for Evaluation and Treatment Rehabilitation  THERAPY DIAG:  Other low back pain  Chronic pain of left knee  Chronic pain of right knee  ONSET DATE: chronic   SUBJECTIVE:                                                                                                                                                                                          Current subjective: "It took me a long time to get another appointment in the pool.  I don't remember how I felt the last time after my session."    SUBJECTIVE STATEMENT: Pt is an 88 y.o. female who presents to PT with chronic LBP and bil knee pain.  Pt has had spinal epidural without relief of symptoms and synovisc injections into bil knees with minimal relief.  Pt reports widespread pain and has had PT ~2 years ago to improve function.  Pt reports that  she is here because her daughters want her to work on the pain and improve her function. PERTINENT HISTORY:  Spinal stenosis, bil knee OA, carpal tunnel  PAIN:  Are you having pain? Yes: NPRS scale: 7/10 Pain location: bil knees and low back Pain description: sore, intense  Aggravating factors: constant pain, standing/walking Relieving factors: ice, heat, pain medication, sitting down   PRECAUTIONS: Fall  WEIGHT BEARING RESTRICTIONS No  FALLS:  Has patient fallen in last 6 months? No  LIVING ENVIRONMENT: Lives with: lives alone Lives in: House/apartment Stairs: Yes: External: 3 steps; ramp Has following equipment at home: Single point cane, Walker - 2 wheeled, shower chair, Grab bars, and Ramped entry  OCCUPATION: retired    PLOF: Independent with basic ADLs Pt does short distance grocery shopping Lives at the [facility]   PATIENT GOALS reduce pain, improve function, improve balance    OBJECTIVE:   DIAGNOSTIC FINDINGS:  Spinal stenosis and bil knee OA    SCREENING FOR RED FLAGS: Bowel or bladder incontinence: No Spinal tumors: No Cauda equina syndrome: No Compression fracture: No Abdominal aneurysm: No  COGNITION:  Overall cognitive status: Within functional limits for tasks assessed     SENSATION: WFL  MUSCLE LENGTH: Rt limited by 50%, Lt limited by 25%  POSTURE: rounded shoulders, forward head, and flexed trunk   PALPATION: Diffuse palpable tenderness over bil knees, shoulders and lumbar spine   LUMBAR ROM:   Limited with mobility, not formally tested  Shoulder ROM: limited by 50% with pain in all directions     LOWER EXTREMITY MMT:    MMT Right eval Left eval  Hip flexion 4-/5 4-/5  Hip extension    Hip abduction 4/5 4/5  Hip adduction    Hip internal rotation    Hip external rotation    Knee flexion 4/5 4/5  Knee extension 4/5 4+/5  Ankle dorsiflexion 4+/5 4+/5  Ankle plantarflexion    Ankle inversion    Ankle eversion     (Blank  rows = not tested)  Bil shoulder strength: 4/5  FUNCTIONAL TESTS:  5 times sit to stand: 14.66 with use of hands on chair  Timed up and go (TUG): 18.03 with cane   GAIT: Distance walked: 50 Assistive device utilized: Single point cane Level of assistance: Modified independence Comments: slow mobility, narrow base of support with scissoring, shortened step length     TODAY'S TREATMENT  Date: 01/13/22 Pt seen for aquatic therapy today.  Treatment took place in water 3.25-4.8 ft in depth at the [facility] pool. Temp of water was 91.  Pt entered/exited the pool via stairs step to pattern cga with bilat rail.   -Walking forward x 6 widths, backward x 4, side ways x2 HHA then ue supported by yellow noodle and cga-sba of therapist. Pt with greatly improved balance submerged -Seated on bench. Min assist to maintain position on bench. Cues for weight shifting forward to maintain indep position. Marching x 20 reps; LAQ R/L 2x10  -STS from bench x 2. VC and demonstration for weight shift  -Standing ue supported on wall: df; pf; marching; add/abd and hip extension x5-10 as tolerated.   Pt requires buoyancy for support and to offload joints with strengthening exercises. Viscosity of the water is needed for resistance of strengthening; water current perturbations provides challenge to standing balance unsupported, requiring increased core activation.     PATIENT EDUCATION:  Education details: pool info, Code: Acupuncturist Person educated: Patient Education method: Programmer, multimedia, Facilities manager, and Handouts Education comprehension: verbalized understanding and returned demonstration   HOME EXERCISE PROGRAM: Access Code: [email or web address] URL: [email or web address] Date: 12/08/2021 Prepared by: Tresa Endo  Exercises - Sit to Stand  - 2 x daily - 7 x weekly - 2 sets - 5 reps - Seated Hamstring Stretch  - 2 x daily - 7 x weekly - 1 sets - 3 reps - 20 hold - Supine Shoulder Flexion  AAROM with Hands Clasped  -  2 x daily - 7 x weekly - 1 sets - 10 reps - 5 hold  ASSESSMENT:  CLINICAL IMPRESSION: Pt requires min assistance of therapist in pool at all times.  Pt with much less apprehension with setting. She able to find COB and maintain standing balance static and dynamic with min-sba. Slight unsteadiness with retro walking.   Improved sitting balance on bench. Cues for weight shift and positioning (need for greatly decreased from last session).  She is slightly limited by right knee pain but given rest period she is able to overcome.  Pt tolerates >30 mins of continuous engagement without rests. Trialed supine position for flutter kicking and scissoring which she tolerates for only short time (right knee pain and calf cramping).  Goals ongoing.   Patient is a 88 y.o. female who was seen today for physical therapy evaluation and treatment for LBP and bil knee pain of a chronic nature. Pt demonstrates functional weakness and balance deficits and reports 8/10 widespread pain.  Pt is limited with standing and walking due to chronic pain and limitations associated with this.  Pt will attend aquatic PT 1x/wk and hopes to be able to transition to an independent program for further mobility advancements.  Patient will benefit from skilled PT to address the below impairments and improve overall function.    OBJECTIVE IMPAIRMENTS Abnormal gait, decreased balance, decreased endurance, decreased mobility, difficulty walking, decreased ROM, impaired flexibility, impaired UE functional use, improper body mechanics, postural dysfunction, and pain.   ACTIVITY LIMITATIONS bending, standing, squatting, stairs, transfers, reach over head, and locomotion level  PARTICIPATION LIMITATIONS: meal prep, cleaning, shopping, and community activity  PERSONAL FACTORS Age, Time since onset of injury/illness/exacerbation, and 1-2 comorbidities: spinal stenosis, chronic LBP, knee OA  are also affecting  patient's functional outcome.   REHAB POTENTIAL: Good  CLINICAL DECISION MAKING: Evolving/moderate complexity  EVALUATION COMPLEXITY: Moderate   GOALS: Goals reviewed with patient? Yes  SHORT TERM GOALS: Target date: 01/05/22  Be independent in initial HEP Baseline: Goal status: INITIAL  2.  Report 25% increased ease with reaching overhead with functional tasks  Baseline: limited Shoulder A/ROM and pain with reaching past 90 Goal status: INITIAL  3.  Improve LE strength to stand from chair with mod UE support  Baseline:  Goal status: INITIAL  4.  Perform TUG in < or = to 15 seconds to improve balance  Baseline:  Goal status: INITIAL    LONG TERM GOALS: Target date: 02/02/22  Be independent in advanced HEP Baseline:  Goal status: INITIAL  2.  Perform sit to stand with min UE support due to improved LE strength  Baseline:  Goal status: INITIAL  3.  Verbalize and demonstrate body mechanics modifications for lumbar and knee protection with functional tasks  Baseline:  Goal status: INITIAL  4.  Perform TUG in < or = to 13 seconds to improve balance  Baseline:  Goal status: INITIAL  5.  Report 50% increased ease with reaching overhead with daily tasks  Baseline:  Goal status: INITIAL   6.  Report a 25% reduction in knee and lumbar pain to improve function   Goal Status: INITIAL     PLAN: PT FREQUENCY: 2x/week  PT DURATION: 8 weeks  PLANNED INTERVENTIONS: Therapeutic exercises, Therapeutic activity, Neuromuscular re-education, Balance training, Gait training, Patient/Family education, Joint mobilization, Stair training, Aquatic Therapy, Dry Needling, Electrical stimulation, Spinal manipulation, Spinal mobilization, Cryotherapy, Moist heat, Taping, and Manual therapy.  PLAN FOR NEXT SESSION: review HEP, aquatics PT to  improve mobility and ability to exercise with joints un weighted.    Corrie Dandy Tomma Lightning) Janiel Derhammer MPT 01/13/22 12:46 PM

## 2022-01-23 ENCOUNTER — Ambulatory Visit: Payer: Self-pay | Admitting: Physical Therapy

## 2022-01-23 DIAGNOSIS — M48061 Spinal stenosis, lumbar region without neurogenic claudication: Secondary | ICD-10-CM | POA: Diagnosis not present

## 2022-01-23 DIAGNOSIS — M170 Bilateral primary osteoarthritis of knee: Secondary | ICD-10-CM | POA: Diagnosis not present

## 2022-01-23 DIAGNOSIS — G894 Chronic pain syndrome: Secondary | ICD-10-CM | POA: Diagnosis not present

## 2022-01-23 DIAGNOSIS — M47816 Spondylosis without myelopathy or radiculopathy, lumbar region: Secondary | ICD-10-CM | POA: Diagnosis not present

## 2022-01-29 ENCOUNTER — Encounter (HOSPITAL_BASED_OUTPATIENT_CLINIC_OR_DEPARTMENT_OTHER): Payer: Self-pay | Admitting: Physical Therapy

## 2022-01-29 ENCOUNTER — Ambulatory Visit (HOSPITAL_BASED_OUTPATIENT_CLINIC_OR_DEPARTMENT_OTHER): Payer: Medicare HMO | Attending: Physical Medicine and Rehabilitation | Admitting: Physical Therapy

## 2022-01-29 DIAGNOSIS — M5459 Other low back pain: Secondary | ICD-10-CM | POA: Diagnosis not present

## 2022-01-29 DIAGNOSIS — M25561 Pain in right knee: Secondary | ICD-10-CM | POA: Insufficient documentation

## 2022-01-29 DIAGNOSIS — G8929 Other chronic pain: Secondary | ICD-10-CM | POA: Insufficient documentation

## 2022-01-29 DIAGNOSIS — M25562 Pain in left knee: Secondary | ICD-10-CM | POA: Diagnosis not present

## 2022-01-29 NOTE — Therapy (Signed)
OUTPATIENT PHYSICAL THERAPY THORACOLUMBAR    Patient Name: Shelia Jimenez MRN: [id number] DOB:04-18-1933, 88 y.o., female Today's Date: 01/29/2022   PT End of Session - 01/29/22 1116     Visit Number 4    Date for PT Re-Evaluation 02/02/22    Authorization Type [employer]    Progress Note Due on Visit 10    PT Start Time 1116    PT Stop Time 1200    PT Time Calculation (min) 44 min    Activity Tolerance Patient tolerated treatment well    Behavior During Therapy WFL for tasks assessed/performed              Past Medical History:  Diagnosis Date   Hypercholesterolemia    Hypertension    Hypothyroidism    Past Surgical History:  Procedure Laterality Date   CATARACT EXTRACTION     MENISECTOMY Right 2015   TONSILLECTOMY AND ADENOIDECTOMY     Patient Active Problem List   Diagnosis Date Noted   Precordial pain 12/14/2012    PCP: Blane Ohara, MD  REFERRING PROVIDER: Verdon Cummins, MD  REFERRING DIAG:  M43.06 (ICD-10-CM) - Lumbar spondylolysis  M17.9 (ICD-10-CM) - OA (osteoarthritis) of knee    Rationale for Evaluation and Treatment Rehabilitation  THERAPY DIAG:  Other low back pain  Chronic pain of left knee  Chronic pain of right knee  ONSET DATE: chronic   SUBJECTIVE:                                                                                                                                                                                          Current subjective: "I hurt all over for 2 days after last session."    SUBJECTIVE STATEMENT: Pt is an 88 y.o. female who presents to PT with chronic LBP and bil knee pain.  Pt has had spinal epidural without relief of symptoms and synovisc injections into bil knees with minimal relief.  Pt reports widespread pain and has had PT ~2 years ago to improve function.  Pt reports that she is here because her daughters want her to work on the pain and improve her function. PERTINENT HISTORY:  Spinal  stenosis, bil knee OA, carpal tunnel  PAIN:  Are you having pain? Yes: NPRS scale: 7/10 Pain location: bil knees and low back Pain description: sore, intense  Aggravating factors: constant pain, standing/walking Relieving factors: ice, heat, pain medication, sitting down   PRECAUTIONS: Fall  WEIGHT BEARING RESTRICTIONS No  FALLS:  Has patient fallen in last 6 months? No  LIVING ENVIRONMENT: Lives with: lives alone Lives in: House/apartment Stairs: Yes: External: 3 steps; ramp  Has following equipment at home: Single point cane, Walker - 2 wheeled, shower chair, Grab bars, and Ramped entry  OCCUPATION: retired    PLOF: Independent with basic ADLs Pt does short distance grocery shopping Lives at the [employer]   PATIENT GOALS reduce pain, improve function, improve balance    OBJECTIVE:   DIAGNOSTIC FINDINGS:  Spinal stenosis and bil knee OA    SCREENING FOR RED FLAGS: Bowel or bladder incontinence: No Spinal tumors: No Cauda equina syndrome: No Compression fracture: No Abdominal aneurysm: No  COGNITION:  Overall cognitive status: Within functional limits for tasks assessed     SENSATION: WFL  MUSCLE LENGTH: Rt limited by 50%, Lt limited by 25%  POSTURE: rounded shoulders, forward head, and flexed trunk   PALPATION: Diffuse palpable tenderness over bil knees, shoulders and lumbar spine   LUMBAR ROM:   Limited with mobility, not formally tested  Shoulder ROM: limited by 50% with pain in all directions     LOWER EXTREMITY MMT:    MMT Right eval Left eval  Hip flexion 4-/5 4-/5  Hip extension    Hip abduction 4/5 4/5  Hip adduction    Hip internal rotation    Hip external rotation    Knee flexion 4/5 4/5  Knee extension 4/5 4+/5  Ankle dorsiflexion 4+/5 4+/5  Ankle plantarflexion    Ankle inversion    Ankle eversion     (Blank rows = not tested)  Bil shoulder strength: 4/5  FUNCTIONAL TESTS:  5 times sit to stand: 14.66 with use of hands  on chair  Timed up and go (TUG): 18.03 with cane   GAIT: Distance walked: 50 Assistive device utilized: Single point cane Level of assistance: Modified independence Comments: slow mobility, narrow base of support with scissoring, shortened step length     TODAY'S TREATMENT  Date: 01/13/22 Pt seen for aquatic therapy today.  Treatment took place in water 3.25-4.8 ft in depth at the [facility] pool. Temp of water was 91.  Pt entered/exited the pool via stairs step to pattern cga with bilat rail.   -Walking forward x 6 widths, backward x 4, side ways x2 HHA then ue supported by yellow noodle and cga-sba of therapist. Pt with greatly improved balance submerged -Seated on bench. Min-cg assist to maintain position on bench. Marching x 20 reps; LAQ R/L 2x10, add/abd 2x10 -STS from bench. Mod assist. VC and demonstration for weight shift/execution. Multiple reps. Practiced weight shifting forward/back to improve positioning prior to standing.  She was unable to complet wthout leaning against bench  -Standing ue supported on wall: df; pf; marching; add/abd and hip extension x5-10 as tolerated.   Pt requires buoyancy for support and to offload joints with strengthening exercises. Viscosity of the water is needed for resistance of strengthening; water current perturbations provides challenge to standing balance unsupported, requiring increased core activation.     PATIENT EDUCATION:  Education details: pool info, Code: Acupuncturist Person educated: Patient Education method: Programmer, multimedia, Facilities manager, and Handouts Education comprehension: verbalized understanding and returned demonstration   HOME EXERCISE PROGRAM: Access Code: [email or web address] URL: [email or web address] Date: 12/08/2021 Prepared by: Tresa Endo  Exercises - Sit to Stand  - 2 x daily - 7 x weekly - 2 sets - 5 reps - Seated Hamstring Stretch  - 2 x daily - 7 x weekly - 1 sets - 3 reps - 20 hold - Supine Shoulder  Flexion AAROM with Hands Clasped  - 2 x daily - 7 x weekly - 1 sets -  10 reps - 5 hold  ASSESSMENT:  CLINICAL IMPRESSION: Pt requires min assistance of therapist in pool at all times.  Improved toleration to setting. Slight unsteadiness with water walking due to long periods between appoints.  She Is able to self correct. She reports fair response to last session with increased allover discomfort which was probably due to increased in activity in that session.  She continues to have difficulty with weight shifting forward for indep STS execution.  Does tolerate >30 mins of continuous activity which she will benefit. She will be seen for on land assessment next visit.  We have had difficulty scheduling her only being seen x 3 since onset.     Patient is a 88 y.o. female who was seen today for physical therapy evaluation and treatment for LBP and bil knee pain of a chronic nature. Pt demonstrates functional weakness and balance deficits and reports 8/10 widespread pain.  Pt is limited with standing and walking due to chronic pain and limitations associated with this.  Pt will attend aquatic PT 1x/wk and hopes to be able to transition to an independent program for further mobility advancements.  Patient will benefit from skilled PT to address the below impairments and improve overall function.    OBJECTIVE IMPAIRMENTS Abnormal gait, decreased balance, decreased endurance, decreased mobility, difficulty walking, decreased ROM, impaired flexibility, impaired UE functional use, improper body mechanics, postural dysfunction, and pain.   ACTIVITY LIMITATIONS bending, standing, squatting, stairs, transfers, reach over head, and locomotion level  PARTICIPATION LIMITATIONS: meal prep, cleaning, shopping, and community activity  PERSONAL FACTORS Age, Time since onset of injury/illness/exacerbation, and 1-2 comorbidities: spinal stenosis, chronic LBP, knee OA  are also affecting patient's functional outcome.    REHAB POTENTIAL: Good  CLINICAL DECISION MAKING: Evolving/moderate complexity  EVALUATION COMPLEXITY: Moderate   GOALS: Goals reviewed with patient? Yes  SHORT TERM GOALS: Target date: 01/05/22  Be independent in initial HEP Baseline: Goal status: INITIAL  2.  Report 25% increased ease with reaching overhead with functional tasks  Baseline: limited Shoulder A/ROM and pain with reaching past 90 Goal status: INITIAL  3.  Improve LE strength to stand from chair with mod UE support  Baseline:  Goal status: INITIAL  4.  Perform TUG in < or = to 15 seconds to improve balance  Baseline:  Goal status: INITIAL    LONG TERM GOALS: Target date: 02/02/22  Be independent in advanced HEP Baseline:  Goal status: INITIAL  2.  Perform sit to stand with min UE support due to improved LE strength  Baseline:  Goal status: INITIAL  3.  Verbalize and demonstrate body mechanics modifications for lumbar and knee protection with functional tasks  Baseline:  Goal status: INITIAL  4.  Perform TUG in < or = to 13 seconds to improve balance  Baseline:  Goal status: INITIAL  5.  Report 50% increased ease with reaching overhead with daily tasks  Baseline:  Goal status: INITIAL   6.  Report a 25% reduction in knee and lumbar pain to improve function   Goal Status: INITIAL     PLAN: PT FREQUENCY: 2x/week  PT DURATION: 8 weeks  PLANNED INTERVENTIONS: Therapeutic exercises, Therapeutic activity, Neuromuscular re-education, Balance training, Gait training, Patient/Family education, Joint mobilization, Stair training, Aquatic Therapy, Dry Needling, Electrical stimulation, Spinal manipulation, Spinal mobilization, Cryotherapy, Moist heat, Taping, and Manual therapy.  PLAN FOR NEXT SESSION: review HEP, aquatics PT to improve mobility and ability to exercise with joints un weighted.  Corrie Dandy Tomma Lightning) Vayla Wilhelmi MPT 01/29/22 2:05 PM

## 2022-01-30 ENCOUNTER — Ambulatory Visit: Payer: Self-pay | Admitting: Physical Therapy

## 2022-02-03 ENCOUNTER — Ambulatory Visit: Payer: Medicare HMO | Attending: Physical Medicine and Rehabilitation

## 2022-02-03 DIAGNOSIS — M25562 Pain in left knee: Secondary | ICD-10-CM | POA: Diagnosis not present

## 2022-02-03 DIAGNOSIS — G8929 Other chronic pain: Secondary | ICD-10-CM | POA: Insufficient documentation

## 2022-02-03 DIAGNOSIS — M5459 Other low back pain: Secondary | ICD-10-CM | POA: Insufficient documentation

## 2022-02-03 DIAGNOSIS — M25561 Pain in right knee: Secondary | ICD-10-CM | POA: Diagnosis not present

## 2022-02-03 DIAGNOSIS — R2689 Other abnormalities of gait and mobility: Secondary | ICD-10-CM | POA: Insufficient documentation

## 2022-02-03 NOTE — Therapy (Signed)
OUTPATIENT PHYSICAL THERAPY THORACOLUMBAR    Patient Name: Shelia Jimenez MRN: [id number] DOB:1932/07/31, 88 y.o., female Today's Date: 02/03/2022   PT End of Session - 02/03/22 1312     Visit Number 5    Date for PT Re-Evaluation 04/01/22    Authorization Type [employer]    Progress Note Due on Visit 10    PT Start Time 1232    PT Stop Time 1311    PT Time Calculation (min) 39 min    Activity Tolerance Patient tolerated treatment well    Behavior During Therapy WFL for tasks assessed/performed               Past Medical History:  Diagnosis Date   Hypercholesterolemia    Hypertension    Hypothyroidism    Past Surgical History:  Procedure Laterality Date   CATARACT EXTRACTION     MENISECTOMY Right 2015   TONSILLECTOMY AND ADENOIDECTOMY     Patient Active Problem List   Diagnosis Date Noted   Precordial pain 12/14/2012    PCP: Rochel Brome, MD  REFERRING PROVIDER: Margaretha Sheffield, MD  REFERRING DIAG:  M43.06 (ICD-10-CM) - Lumbar spondylolysis  M17.9 (ICD-10-CM) - OA (osteoarthritis) of knee    Rationale for Evaluation and Treatment Rehabilitation  THERAPY DIAG:  Other low back pain - Plan: PT plan of care cert/re-cert  Chronic pain of left knee - Plan: PT plan of care cert/re-cert  Chronic pain of right knee - Plan: PT plan of care cert/re-cert  Other abnormalities of gait and mobility - Plan: PT plan of care cert/re-cert  ONSET DATE: chronic   SUBJECTIVE:                                                                                                                                                                                          I had a conflict for many of my appointments so I haven't been able to get to the pool much.  I was apprehensive at first but I am feeling better about it.     SUBJECTIVE STATEMENT: Pt is an 88 y.o. female who presents to PT with chronic LBP and bil knee pain.  Pt has had spinal epidural without relief of  symptoms and synovisc injections into bil knees with minimal relief.  Pt reports widespread pain and has had PT ~2 years ago to improve function.  Pt reports that she is here because her daughters want her to work on the pain and improve her function. PERTINENT HISTORY:  Spinal stenosis, bil knee OA, carpal tunnel  PAIN:  Are you having pain? Yes: NPRS scale: 3-8/10 Pain location: bil  knees and low back Pain description: sore, intense  Aggravating factors: constant pain, standing/walking Relieving factors: ice, heat, pain medication, sitting down   PRECAUTIONS: Fall  WEIGHT BEARING RESTRICTIONS No  FALLS:  Has patient fallen in last 6 months? No  LIVING ENVIRONMENT: Lives with: lives alone Lives in: House/apartment Stairs: Yes: External: 3 steps; ramp Has following equipment at home: Single point cane, Walker - 2 wheeled, shower chair, Grab bars, and Ramped entry  OCCUPATION: retired    PLOF: Independent with basic ADLs Pt does short distance grocery shopping Lives at the [facility]   PATIENT GOALS reduce pain, improve function, improve balance   OBJECTIVE:   DIAGNOSTIC FINDINGS:  Spinal stenosis and bil knee OA  SCREENING FOR RED FLAGS: Bowel or bladder incontinence: No Spinal tumors: No Cauda equina syndrome: No Compression fracture: No Abdominal aneurysm: No  COGNITION:  Overall cognitive status: Within functional limits for tasks assessed     SENSATION: WFL  MUSCLE LENGTH: Rt limited by 50%, Lt limited by 25%  POSTURE: rounded shoulders, forward head, and flexed trunk   PALPATION: Diffuse palpable tenderness over bil knees, shoulders and lumbar spine   LUMBAR ROM:   Limited with mobility, not formally tested  Shoulder ROM: limited by 50% with pain in all directions     LOWER EXTREMITY MMT:    MMT Right eval Left eval  Hip flexion 4-/5 4-/5  Hip extension    Hip abduction 4/5 4/5  Hip adduction    Hip internal rotation    Hip external  rotation    Knee flexion 4/5 4/5  Knee extension 4/5 4+/5  Ankle dorsiflexion 4+/5 4+/5  Ankle plantarflexion    Ankle inversion    Ankle eversion     (Blank rows = not tested)  Bil shoulder strength: 4/5  FUNCTIONAL TESTS:  5 times sit to stand: 14.66 with use of hands on chair  Timed up and go (TUG): 18.03 with cane  02/03/22: 5x sit to stand: 14 seconds  TUG: 17.87 seconds with walker  GAIT: Distance walked: 50 Assistive device utilized: Single point cane Level of assistance: Modified independence Comments: slow mobility, narrow base of support with scissoring, shortened step length    TODAY'S TREATMENT  Date: 02/03/22 Reassessment performed today-see above objective measures  Added to HEP: shoulder flexion, hip adduction with pillow, heel/toe raises  Seated hamstring stretch 3x20 seconds    Date: 01/13/22 Pt seen for aquatic therapy today.  Treatment took place in water 3.25-4.8 ft in depth at the [facility] pool. Temp of water was 91.  Pt entered/exited the pool via stairs step to pattern cga with bilat rail.   -Walking forward x 6 widths, backward x 4, side ways x2 HHA then ue supported by yellow noodle and cga-sba of therapist. Pt with greatly improved balance submerged -Seated on bench. Min-cg assist to maintain position on bench. Marching x 20 reps; LAQ R/L 2x10, add/abd 2x10 -STS from bench. Mod assist. VC and demonstration for weight shift/execution. Multiple reps. Practiced weight shifting forward/back to improve positioning prior to standing.  She was unable to complet wthout leaning against bench  -Standing ue supported on wall: df; pf; marching; add/abd and hip extension x5-10 as tolerated.   Pt requires buoyancy for support and to offload joints with strengthening exercises. Viscosity of the water is needed for resistance of strengthening; water current perturbations provides challenge to standing balance unsupported, requiring increased core  activation.    PATIENT EDUCATION:  Education details: pool info, Code:  [email or web address] Person educated: Patient Education method: Explanation, Demonstration, and Handouts Education comprehension: verbalized understanding and returned demonstration   HOME EXERCISE PROGRAM: Access Code: [email or web address] URL: [email or web address] Date: 02/03/2022 Prepared by: Tresa Endo  Exercises - Sit to Stand  - 2 x daily - 7 x weekly - 2 sets - 5 reps - Seated Hamstring Stretch  - 2 x daily - 7 x weekly - 1 sets - 3 reps - 20 hold - Supine Shoulder Flexion AAROM with Hands Clasped  - 2 x daily - 7 x weekly - 1 sets - 10 reps - 5 hold - Seated Shoulder Flexion  - 2 x daily - 7 x weekly - 1 sets - 10 reps - Seated Isometric Hip Adduction with Ball  - 2 x daily - 7 x weekly - 1 sets - 10 reps - 5 hold - Seated Heel Toe Raises  - 3 x daily - 7 x weekly - 2 sets - 10 reps  ASSESSMENT:  CLINICAL IMPRESSION:   Reassessment complete today with minimal change in function due to limited attendance at PT.  Pt with improved sit to stand with moderate UE support.  Pt with chronic pain that is unchanged.  Pt with improved UE ROM with pain above 90 degrees. Pt is now using a walker for community mobility for safety.  Pt will continue to benefit from aquatics for strength and mobility advancement.  Patient will benefit from skilled PT to address the below impairments and improve overall function.   OBJECTIVE IMPAIRMENTS Abnormal gait, decreased balance, decreased endurance, decreased mobility, difficulty walking, decreased ROM, impaired flexibility, impaired UE functional use, improper body mechanics, postural dysfunction, and pain.   ACTIVITY LIMITATIONS bending, standing, squatting, stairs, transfers, reach over head, and locomotion level  PARTICIPATION LIMITATIONS: meal prep, cleaning, shopping, and community activity  PERSONAL FACTORS Age, Time since onset of injury/illness/exacerbation, and 1-2 comorbidities:  spinal stenosis, chronic LBP, knee OA  are also affecting patient's functional outcome.   REHAB POTENTIAL: Good  CLINICAL DECISION MAKING: Evolving/moderate complexity  EVALUATION COMPLEXITY: Moderate   GOALS: Goals reviewed with patient? Yes  SHORT TERM GOALS: Target date: 03/02/22  Be independent in initial HEP Baseline: Goal status: On going   2.  Report 25% increased ease with reaching overhead with functional tasks  Baseline: limited Shoulder A/ROM and pain with reaching past 90 Goal status: On going   3.  Improve LE strength to stand from chair with min UE support  Baseline: mod UE support (02/03/22) Goal status: revised   4.  Perform TUG in < or = to 15 seconds to improve balance  Baseline:  Goal status: on going     LONG TERM GOALS: Target date: 03/31/22  Be independent in advanced HEP Baseline:  Goal status: In progress   2.  Perform sit to stand with min UE support due to improved LE strength  Baseline: moderate UE support (02/03/22) Goal status: In progress   3.  Verbalize and demonstrate body mechanics modifications for lumbar and knee protection with functional tasks  Baseline:  Goal status: In progress   4.  Perform TUG in < or = to 13 seconds to improve balance  Baseline:  Goal status: In progress   5.  Report 50% increased ease with reaching overhead with daily tasks  Baseline: 10% increased ease (02/03/22) Goal status: in progress    6.  Report a 25% reduction in knee and lumbar pain to improve function Baseline: no change (02/03/22)   Goal Status: In  progress      PLAN: PT FREQUENCY: 1-2x/wk  PT DURATION: 8 weeks  PLANNED INTERVENTIONS: Therapeutic exercises, Therapeutic activity, Neuromuscular re-education, Balance training, Gait training, Patient/Family education, Joint mobilization, Stair training, Aquatic Therapy, Dry Needling, Electrical stimulation, Spinal manipulation, Spinal mobilization, Cryotherapy, Moist heat, Taping, and Manual  therapy.  PLAN FOR NEXT SESSION: aquatic PT to strengthen muscles and improve mobility, reduce pain    Sigurd Sos, PT 02/03/22 1:25 PM

## 2022-02-05 ENCOUNTER — Ambulatory Visit (HOSPITAL_BASED_OUTPATIENT_CLINIC_OR_DEPARTMENT_OTHER): Payer: Medicare HMO | Admitting: Physical Therapy

## 2022-02-05 ENCOUNTER — Encounter (HOSPITAL_BASED_OUTPATIENT_CLINIC_OR_DEPARTMENT_OTHER): Payer: Self-pay | Admitting: Physical Therapy

## 2022-02-05 DIAGNOSIS — G8929 Other chronic pain: Secondary | ICD-10-CM

## 2022-02-05 DIAGNOSIS — M25562 Pain in left knee: Secondary | ICD-10-CM | POA: Diagnosis not present

## 2022-02-05 DIAGNOSIS — M5459 Other low back pain: Secondary | ICD-10-CM

## 2022-02-05 DIAGNOSIS — M25561 Pain in right knee: Secondary | ICD-10-CM | POA: Diagnosis not present

## 2022-02-05 NOTE — Therapy (Signed)
OUTPATIENT PHYSICAL THERAPY THORACOLUMBAR    Patient Name: Shelia Jimenez MRN: [id number] DOB:10/29/1932, 88 y.o., female Today's Date: 02/05/2022    PT End of Session - 01/29/22 1116       Visit Number 5    Date for PT Re-Evaluation 04/01/22    Authorization Type [employer] Medicare     Progress Note Due on Visit 10     PT Start Time 1116     PT Stop Time 1200     PT Time Calculation (min) 44 min  PROGRESS NOTE DUE ON VISIT 15    Activity Tolerance Patient tolerated treatment well     Behavior During Therapy [facility] for tasks assessed/performed       PT End of Session - 02/05/22 1637     Visit Number 6    Date for PT Re-Evaluation 04/01/22    Authorization Type [employer] Medicare    Authorization Time Period PROGRESS NOTE IS DUE ON VISIT 15    Progress Note Due on Visit 10    PT Start Time 1618    PT Stop Time 1700    PT Time Calculation (min) 42 min    Activity Tolerance Patient tolerated treatment well    Behavior During Therapy [facility] for tasks assessed/performed             PT End of Session - 02/05/22 1637     Visit Number 6    Date for PT Re-Evaluation 04/01/22    Authorization Type [employer] Medicare    Authorization Time Period PROGRESS NOTE IS DUE ON VISIT 15    Progress Note Due on Visit 10    PT Start Time 1618    PT Stop Time 1700    PT Time Calculation (min) 42 min    Activity Tolerance Patient tolerated treatment well    Behavior During Therapy WFL for tasks assessed/performed                Past Medical History:  Diagnosis Date   Hypercholesterolemia    Hypertension    Hypothyroidism    Past Surgical History:  Procedure Laterality Date   CATARACT EXTRACTION     MENISECTOMY Right 2015   TONSILLECTOMY AND ADENOIDECTOMY     Patient Active Problem List   Diagnosis Date Noted   Precordial pain 12/14/2012    PCP: Rochel Brome, MD  REFERRING PROVIDER: Margaretha Sheffield, MD  REFERRING DIAG:  M43.06 (ICD-10-CM) - Lumbar spondylolysis  M17.9  (ICD-10-CM) - OA (osteoarthritis) of knee    Rationale for Evaluation and Treatment Rehabilitation  THERAPY DIAG:  Other low back pain  Chronic pain of left knee  Chronic pain of right knee  ONSET DATE: chronic   SUBJECTIVE:                                                                                                                                                                                          "  I have noticed that the fluid in my knee has gone down"  " My hips hurt for a few days after last session"   SUBJECTIVE STATEMENT: Pt is an 88 y.o. female who presents to PT with chronic LBP and bil knee pain.  Pt has had spinal epidural without relief of symptoms and synovisc injections into bil knees with minimal relief.  Pt reports widespread pain and has had PT ~2 years ago to improve function.  Pt reports that she is here because her daughters want her to work on the pain and improve her function. PERTINENT HISTORY:  Spinal stenosis, bil knee OA, carpal tunnel  PAIN:  Are you having pain? Yes: NPRS scale: Current: 3/10  min-max 3-8/10 Pain location: bil knees and low back Pain description: sore, intense  Aggravating factors: constant pain, standing/walking Relieving factors: ice, heat, pain medication, sitting down   PRECAUTIONS: Fall  WEIGHT BEARING RESTRICTIONS No  FALLS:  Has patient fallen in last 6 months? No  LIVING ENVIRONMENT: Lives with: lives alone Lives in: House/apartment Stairs: Yes: External: 3 steps; ramp Has following equipment at home: Single point cane, Walker - 2 wheeled, shower chair, Grab bars, and Ramped entry  OCCUPATION: retired    PLOF: Independent with basic ADLs Pt does short distance grocery shopping Lives at the [place] reduce pain, improve function, improve balance   OBJECTIVE:   DIAGNOSTIC FINDINGS:  Spinal stenosis and bil knee OA  SCREENING FOR RED FLAGS: Bowel or bladder incontinence: No Spinal  tumors: No Cauda equina syndrome: No Compression fracture: No Abdominal aneurysm: No  COGNITION:  Overall cognitive status: Within functional limits for tasks assessed     SENSATION: WFL  MUSCLE LENGTH: Rt limited by 50%, Lt limited by 25%  POSTURE: rounded shoulders, forward head, and flexed trunk   PALPATION: Diffuse palpable tenderness over bil knees, shoulders and lumbar spine   LUMBAR ROM:   Limited with mobility, not formally tested  Shoulder ROM: limited by 50% with pain in all directions     LOWER EXTREMITY MMT:    MMT Right eval Left eval  Hip flexion 4-/5 4-/5  Hip extension    Hip abduction 4/5 4/5  Hip adduction    Hip internal rotation    Hip external rotation    Knee flexion 4/5 4/5  Knee extension 4/5 4+/5  Ankle dorsiflexion 4+/5 4+/5  Ankle plantarflexion    Ankle inversion    Ankle eversion     (Blank rows = not tested)  Bil shoulder strength: 4/5  FUNCTIONAL TESTS:  5 times sit to stand: 14.66 with use of hands on chair  Timed up and go (TUG): 18.03 with cane  02/03/22: 5x sit to stand: 14 seconds  TUG: 17.87 seconds with walker  GAIT: Distance walked: 50 Assistive device utilized: Single point cane Level of assistance: Modified independence Comments: slow mobility, narrow base of support with scissoring, shortened step length    TODAY'S TREATMENT  Date: 02/05/22 Pt seen for aquatic therapy today.  Treatment took place in water 3.25-4.8 ft in depth at the [facility] pool. Temp of water was 91.  Pt entered/exited the pool via stairs step to pattern cga with bilat rail.   -Walking forward x 6 widths, backward x 4, side stepping x 4 supported by yellow noodle and sup.  -Standing ue supported on wall: df; pf; marching; add/abd and hip extension x10-12. -Seated on bench. CGA to gain position. Marching x 20 reps; LAQ R/L 2x  20; cycling 2x 2 mins -STS from bench min-cga onto pool bottom. Mod assist rising onto water step, pt with  tendency to lean backward. VC and demonstration for weight shift/execution. Multiple reps. Practiced weight shifting/rocking forward/back to improve positioning prior to standing.  She was unable to complete wthout leaning against bench     Pt requires buoyancy for support and to offload joints with strengthening exercises. Viscosity of the water is needed for resistance of strengthening; water current perturbations provides challenge to standing balance unsupported, requiring increased core activation.   PATIENT EDUCATION:  Education details: pool info, Code: Acupuncturist Person educated: Patient Education method: Programmer, multimedia, Facilities manager, and Handouts Education comprehension: verbalized understanding and returned demonstration   HOME EXERCISE PROGRAM: Access Code: [email or web address] URL: [email or web address] Date: 02/03/2022 Prepared by: Tresa Endo  Exercises - Sit to Stand  - 2 x daily - 7 x weekly - 2 sets - 5 reps - Seated Hamstring Stretch  - 2 x daily - 7 x weekly - 1 sets - 3 reps - 20 hold - Supine Shoulder Flexion AAROM with Hands Clasped  - 2 x daily - 7 x weekly - 1 sets - 10 reps - 5 hold - Seated Shoulder Flexion  - 2 x daily - 7 x weekly - 1 sets - 10 reps - Seated Isometric Hip Adduction with Ball  - 2 x daily - 7 x weekly - 1 sets - 10 reps - 5 hold - Seated Heel Toe Raises  - 3 x daily - 7 x weekly - 2 sets - 10 reps  ASSESSMENT:  CLINICAL IMPRESSION:   She arrives using fww for safety due to distance from parking lot to pool. Modified seated exercises to discontinue add/abd as pt report increase in hip pain from completion. Pt with improving balance/core strength as evidenced by decreased assistance needed with water walking and STS transfers as well as increased reps of exercises. Moderate cues and cga for weight shifting to gain immediate standing balance when rising onto water step from bench.  Goals ongoing.  Reassessment complete today with minimal change in  function due to limited attendance at PT.  Pt with improved sit to stand with moderate UE support.  Pt with chronic pain that is unchanged.  Pt with improved UE ROM with pain above 90 degrees. Pt is now using a walker for community mobility for safety.  Pt will continue to benefit from aquatics for strength and mobility advancement.  Patient will benefit from skilled PT to address the below impairments and improve overall function.   OBJECTIVE IMPAIRMENTS Abnormal gait, decreased balance, decreased endurance, decreased mobility, difficulty walking, decreased ROM, impaired flexibility, impaired UE functional use, improper body mechanics, postural dysfunction, and pain.   ACTIVITY LIMITATIONS bending, standing, squatting, stairs, transfers, reach over head, and locomotion level  PARTICIPATION LIMITATIONS: meal prep, cleaning, shopping, and community activity  PERSONAL FACTORS Age, Time since onset of injury/illness/exacerbation, and 1-2 comorbidities: spinal stenosis, chronic LBP, knee OA  are also affecting patient's functional outcome.   REHAB POTENTIAL: Good  CLINICAL DECISION MAKING: Evolving/moderate complexity  EVALUATION COMPLEXITY: Moderate   GOALS: Goals reviewed with patient? Yes  SHORT TERM GOALS: Target date: 03/02/22  Be independent in initial HEP Baseline: Goal status: On going   2.  Report 25% increased ease with reaching overhead with functional tasks  Baseline: limited Shoulder A/ROM and pain with reaching past 90 Goal status: On going   3.  Improve LE strength to stand from chair with min UE support  Baseline: mod UE support (02/03/22) Goal status: revised   4.  Perform TUG in < or = to 15 seconds to improve balance  Baseline:  Goal status: on going     LONG TERM GOALS: Target date: 03/31/22  Be independent in advanced HEP Baseline:  Goal status: In progress   2.  Perform sit to stand with min UE support due to improved LE strength  Baseline: moderate UE  support (02/03/22) Goal status: In progress   3.  Verbalize and demonstrate body mechanics modifications for lumbar and knee protection with functional tasks  Baseline:  Goal status: In progress   4.  Perform TUG in < or = to 13 seconds to improve balance  Baseline:  Goal status: In progress   5.  Report 50% increased ease with reaching overhead with daily tasks  Baseline: 10% increased ease (02/03/22) Goal status: in progress    6.  Report a 25% reduction in knee and lumbar pain to improve function Baseline: no change (02/03/22)   Goal Status: In progress      PLAN: PT FREQUENCY: 1-2x/wk  PT DURATION: 8 weeks  PLANNED INTERVENTIONS: Therapeutic exercises, Therapeutic activity, Neuromuscular re-education, Balance training, Gait training, Patient/Family education, Joint mobilization, Stair training, Aquatic Therapy, Dry Needling, Electrical stimulation, Spinal manipulation, Spinal mobilization, Cryotherapy, Moist heat, Taping, and Manual therapy.  PLAN FOR NEXT SESSION: aquatic PT to strengthen muscles and improve mobility, reduce pain    Rushie Chestnut) Dymon Summerhill MPT 02/05/22 4:43 PM

## 2022-02-18 DIAGNOSIS — G8929 Other chronic pain: Secondary | ICD-10-CM | POA: Diagnosis not present

## 2022-02-18 DIAGNOSIS — Z0000 Encounter for general adult medical examination without abnormal findings: Secondary | ICD-10-CM | POA: Diagnosis not present

## 2022-02-18 DIAGNOSIS — E039 Hypothyroidism, unspecified: Secondary | ICD-10-CM | POA: Diagnosis not present

## 2022-02-18 DIAGNOSIS — E782 Mixed hyperlipidemia: Secondary | ICD-10-CM | POA: Diagnosis not present

## 2022-02-18 DIAGNOSIS — Z1331 Encounter for screening for depression: Secondary | ICD-10-CM | POA: Diagnosis not present

## 2022-02-18 DIAGNOSIS — I10 Essential (primary) hypertension: Secondary | ICD-10-CM | POA: Diagnosis not present

## 2022-02-18 DIAGNOSIS — R591 Generalized enlarged lymph nodes: Secondary | ICD-10-CM | POA: Diagnosis not present

## 2022-02-20 DIAGNOSIS — G894 Chronic pain syndrome: Secondary | ICD-10-CM | POA: Diagnosis not present

## 2022-02-20 DIAGNOSIS — M47816 Spondylosis without myelopathy or radiculopathy, lumbar region: Secondary | ICD-10-CM | POA: Diagnosis not present

## 2022-02-20 DIAGNOSIS — M48061 Spinal stenosis, lumbar region without neurogenic claudication: Secondary | ICD-10-CM | POA: Diagnosis not present

## 2022-02-20 DIAGNOSIS — M170 Bilateral primary osteoarthritis of knee: Secondary | ICD-10-CM | POA: Diagnosis not present

## 2022-02-27 ENCOUNTER — Encounter (HOSPITAL_BASED_OUTPATIENT_CLINIC_OR_DEPARTMENT_OTHER): Payer: Self-pay | Admitting: Physical Therapy

## 2022-02-27 ENCOUNTER — Ambulatory Visit (HOSPITAL_BASED_OUTPATIENT_CLINIC_OR_DEPARTMENT_OTHER): Payer: Medicare HMO | Attending: Physical Medicine and Rehabilitation | Admitting: Physical Therapy

## 2022-02-27 DIAGNOSIS — M5459 Other low back pain: Secondary | ICD-10-CM | POA: Diagnosis not present

## 2022-02-27 DIAGNOSIS — M25561 Pain in right knee: Secondary | ICD-10-CM | POA: Insufficient documentation

## 2022-02-27 DIAGNOSIS — G8929 Other chronic pain: Secondary | ICD-10-CM | POA: Diagnosis not present

## 2022-02-27 DIAGNOSIS — R2689 Other abnormalities of gait and mobility: Secondary | ICD-10-CM | POA: Diagnosis not present

## 2022-02-27 DIAGNOSIS — M25562 Pain in left knee: Secondary | ICD-10-CM | POA: Diagnosis not present

## 2022-02-27 NOTE — Therapy (Signed)
OUTPATIENT PHYSICAL THERAPY THORACOLUMBAR    Patient Name: Shelia Jimenez MRN: [id number] DOB:Mar 31, 1933, 88 y.o., female Today's Date: 02/27/2022    PT End of Session - 01/29/22 1116       Visit Number 5    Date for PT Re-Evaluation 04/01/22    Authorization Type [employer]     Progress Note Due on Visit 10     PT Start Time 1116     PT Stop Time 1200     PT Time Calculation (min) 44 min  PROGRESS NOTE DUE ON VISIT 15    Activity Tolerance Patient tolerated treatment well     Behavior During Therapy [facility] for tasks assessed/performed       PT End of Session - 02/27/22 0929     Visit Number 7    Date for PT Re-Evaluation 04/01/22    Authorization Type [employer] Medicare    Authorization Time Period PROGRESS NOTE IS DUE ON VISIT 15    Progress Note Due on Visit 15    PT Start Time 0903    PT Stop Time 0945    PT Time Calculation (min) 42 min    Activity Tolerance Patient tolerated treatment well    Behavior During Therapy [facility] for tasks assessed/performed             PT End of Session - 02/27/22 0929     Visit Number 7    Date for PT Re-Evaluation 04/01/22    Authorization Type [employer] Medicare    Authorization Time Period PROGRESS NOTE IS DUE ON VISIT 15    Progress Note Due on Visit 15    PT Start Time 0903    PT Stop Time 0945    PT Time Calculation (min) 42 min    Activity Tolerance Patient tolerated treatment well    Behavior During Therapy WFL for tasks assessed/performed                Past Medical History:  Diagnosis Date   Hypercholesterolemia    Hypertension    Hypothyroidism    Past Surgical History:  Procedure Laterality Date   CATARACT EXTRACTION     MENISECTOMY Right 2015   TONSILLECTOMY AND ADENOIDECTOMY     Patient Active Problem List   Diagnosis Date Noted   Precordial pain 12/14/2012    PCP: Blane Ohara, MD  REFERRING PROVIDER: Verdon Cummins, MD  REFERRING DIAG:  M43.06 (ICD-10-CM) - Lumbar spondylolysis  M17.9  (ICD-10-CM) - OA (osteoarthritis) of knee    Rationale for Evaluation and Treatment Rehabilitation  THERAPY DIAG:  Other low back pain  Chronic pain of left knee  Chronic pain of right knee  ONSET DATE: chronic   SUBJECTIVE:                                                                                                                                                                                          "  I 'm hoping with more consistent session my knees will feel better."   SUBJECTIVE STATEMENT: Pt is an 88 y.o. female who presents to PT with chronic LBP and bil knee pain.  Pt has had spinal epidural without relief of symptoms and synovisc injections into bil knees with minimal relief.  Pt reports widespread pain and has had PT ~2 years ago to improve function.  Pt reports that she is here because her daughters want her to work on the pain and improve her function. PERTINENT HISTORY:  Spinal stenosis, bil knee OA, carpal tunnel  PAIN:  Are you having pain? Yes: NPRS scale: Current: 3/10  min-max 3-8/10 Pain location: bil knees and low back Pain description: sore, intense  Aggravating factors: constant pain, standing/walking Relieving factors: ice, heat, pain medication, sitting down   PRECAUTIONS: Fall  WEIGHT BEARING RESTRICTIONS No  FALLS:  Has patient fallen in last 6 months? No  LIVING ENVIRONMENT: Lives with: lives alone Lives in: House/apartment Stairs: Yes: External: 3 steps; ramp Has following equipment at home: Single point cane, Walker - 2 wheeled, shower chair, Grab bars, and Ramped entry  OCCUPATION: retired    PLOF: Independent with basic ADLs Pt does short distance grocery shopping Lives at the [facility]   PATIENT GOALS reduce pain, improve function, improve balance   OBJECTIVE:   DIAGNOSTIC FINDINGS:  Spinal stenosis and bil knee OA  SCREENING FOR RED FLAGS: Bowel or bladder incontinence: No Spinal tumors: No Cauda equina syndrome:  No Compression fracture: No Abdominal aneurysm: No  COGNITION:  Overall cognitive status: Within functional limits for tasks assessed     SENSATION: WFL  MUSCLE LENGTH: Rt limited by 50%, Lt limited by 25%  POSTURE: rounded shoulders, forward head, and flexed trunk   PALPATION: Diffuse palpable tenderness over bil knees, shoulders and lumbar spine   LUMBAR ROM:   Limited with mobility, not formally tested  Shoulder ROM: limited by 50% with pain in all directions     LOWER EXTREMITY MMT:    MMT Right eval Left eval  Hip flexion 4-/5 4-/5  Hip extension    Hip abduction 4/5 4/5  Hip adduction    Hip internal rotation    Hip external rotation    Knee flexion 4/5 4/5  Knee extension 4/5 4+/5  Ankle dorsiflexion 4+/5 4+/5  Ankle plantarflexion    Ankle inversion    Ankle eversion     (Blank rows = not tested)  Bil shoulder strength: 4/5  FUNCTIONAL TESTS:  5 times sit to stand: 14.66 with use of hands on chair  Timed up and go (TUG): 18.03 with cane  02/03/22: 5x sit to stand: 14 seconds  TUG: 17.87 seconds with walker  GAIT: Distance walked: 50 Assistive device utilized: Single point cane Level of assistance: Modified independence Comments: slow mobility, narrow base of support with scissoring, shortened step length    TODAY'S TREATMENT  Date: 02/27/22 Pt seen for aquatic therapy today.  Treatment took place in water 3.25-4.8 ft in depth at the [facility] pool. Temp of water was 91.  Pt entered/exited the pool via stairs step to pattern cga with bilat rail.   -Walking forward x 6 widths, backward x 4, side stepping x 4 supported by white barbell and cga in 26ft -Standing ue supported on wall: df; pf; marching; add/abd, squats and hip extension x10-12. Squats x10 holding to white barabell with cga.  Cues for positioing on bench to maintain sitting balance submerged -Seated on bench. Continuous CGA  to [place] position position. Marching x 20 reps; LAQ  R/L 2x 20; Lumbar stretch and rotation -STS from bench min-cga onto pool bottom. Mod assist rising onto water step, pt with contiuneued tendency to lean back .  She was able 5/10 x o gain immediate standiing balance without leaning on bench.  -Stair climbing instruction to exit pool. She completes with cga using step to gait. Cues for weight shifting forward onto legs rather than pulling     Pt requires buoyancy for support and to offload joints with strengthening exercises. Viscosity of the water is needed for resistance of strengthening; water current perturbations provides challenge to standing balance unsupported, requiring increased core activation.   PATIENT EDUCATION:  Education details: pool info, Code: Acupuncturist Person educated: Patient Education method: Programmer, multimedia, Facilities manager, and Handouts Education comprehension: verbalized understanding and returned demonstration   HOME EXERCISE PROGRAM: Access Code: [email or web address] URL: [email or web address] Date: 02/03/2022 Prepared by: Tresa Endo  Exercises - Sit to Stand  - 2 x daily - 7 x weekly - 2 sets - 5 reps - Seated Hamstring Stretch  - 2 x daily - 7 x weekly - 1 sets - 3 reps - 20 hold - Supine Shoulder Flexion AAROM with Hands Clasped  - 2 x daily - 7 x weekly - 1 sets - 10 reps - 5 hold - Seated Shoulder Flexion  - 2 x daily - 7 x weekly - 1 sets - 10 reps - Seated Isometric Hip Adduction with Ball  - 2 x daily - 7 x weekly - 1 sets - 10 reps - 5 hold - Seated Heel Toe Raises  - 3 x daily - 7 x weekly - 2 sets - 10 reps  ASSESSMENT:  CLINICAL IMPRESSION:   Pt with 3 week hiatus of treatment due to scheduling difficulies.  She enters and walks in pool with good confidence although does require assistance for safety.  Worked on [employer] and [place]. She requires moderate VC and demonstration for weight shifting to gain and maintain sitting and standing balance. Pt more consistently scheduled going forward.  She will continue  to benefit from aquati skilled therapy intervention to improve functional mobility and decrease fall risk. She is given [facility] transport from therapy, walked in.    OBJECTIVE IMPAIRMENTS Abnormal gait, decreased balance, decreased endurance, decreased mobility, difficulty walking, decreased ROM, impaired flexibility, impaired UE functional use, improper body mechanics, postural dysfunction, and pain.   ACTIVITY LIMITATIONS bending, standing, squatting, stairs, transfers, reach over head, and locomotion level  PARTICIPATION LIMITATIONS: meal prep, cleaning, shopping, and community activity  PERSONAL FACTORS Age, Time since onset of injury/illness/exacerbation, and 1-2 comorbidities: spinal stenosis, chronic LBP, knee OA  are also affecting patient's functional outcome.   REHAB POTENTIAL: Good  CLINICAL DECISION MAKING: Evolving/moderate complexity  EVALUATION COMPLEXITY: Moderate   GOALS: Goals reviewed with patient? Yes  SHORT TERM GOALS: Target date: 03/02/22  Be independent in initial HEP Baseline: Goal status: On going   2.  Report 25% increased ease with reaching overhead with functional tasks  Baseline: limited Shoulder A/ROM and pain with reaching past 90 Goal status: On going   3.  Improve LE strength to stand from chair with min UE support  Baseline: mod UE support (02/03/22) Goal status: revised   4.  Perform TUG in < or = to 15 seconds to improve balance  Baseline:  Goal status: on going     LONG TERM GOALS: Target date: 03/31/22  Be independent in advanced HEP Baseline:  Goal status: In progress   2.  Perform sit to stand with min UE support due to improved LE strength  Baseline: moderate UE support (02/03/22) Goal status: In progress   3.  Verbalize and demonstrate body mechanics modifications for lumbar and knee protection with functional tasks  Baseline:  Goal status: In progress   4.  Perform TUG in < or = to 13 seconds to improve balance  Baseline:  Goal  status: In progress   5.  Report 50% increased ease with reaching overhead with daily tasks  Baseline: 10% increased ease (02/03/22) Goal status: in progress    6.  Report a 25% reduction in knee and lumbar pain to improve function Baseline: no change (02/03/22)   Goal Status: In progress      PLAN: PT FREQUENCY: 1-2x/wk  PT DURATION: 8 weeks  PLANNED INTERVENTIONS: Therapeutic exercises, Therapeutic activity, Neuromuscular re-education, Balance training, Gait training, Patient/Family education, Joint mobilization, Stair training, Aquatic Therapy, Dry Needling, Electrical stimulation, Spinal manipulation, Spinal mobilization, Cryotherapy, Moist heat, Taping, and Manual therapy.  PLAN FOR NEXT SESSION: aquatic PT to strengthen muscles and improve mobility, reduce pain    Shelia Jimenez MPT 02/27/22 2:07 PM

## 2022-03-03 ENCOUNTER — Encounter (HOSPITAL_BASED_OUTPATIENT_CLINIC_OR_DEPARTMENT_OTHER): Payer: Self-pay | Admitting: Physical Therapy

## 2022-03-03 ENCOUNTER — Ambulatory Visit (HOSPITAL_BASED_OUTPATIENT_CLINIC_OR_DEPARTMENT_OTHER): Payer: Medicare HMO | Admitting: Physical Therapy

## 2022-03-03 DIAGNOSIS — M25561 Pain in right knee: Secondary | ICD-10-CM | POA: Diagnosis not present

## 2022-03-03 DIAGNOSIS — M5459 Other low back pain: Secondary | ICD-10-CM | POA: Diagnosis not present

## 2022-03-03 DIAGNOSIS — R2689 Other abnormalities of gait and mobility: Secondary | ICD-10-CM | POA: Diagnosis not present

## 2022-03-03 DIAGNOSIS — G8929 Other chronic pain: Secondary | ICD-10-CM | POA: Diagnosis not present

## 2022-03-03 DIAGNOSIS — M25562 Pain in left knee: Secondary | ICD-10-CM | POA: Diagnosis not present

## 2022-03-03 NOTE — Therapy (Signed)
OUTPATIENT PHYSICAL THERAPY THORACOLUMBAR    Patient Name: Shelia Jimenez MRN: [id number] DOB:1933-06-13, 88 y.o., female Today's Date: 03/03/2022    PT End of Session - 01/29/22 1116       Visit Number 5    Date for PT Re-Evaluation 04/01/22    Authorization Type [employer] Medicare     Progress Note Due on Visit 10     PT Start Time 1116     PT Stop Time 1200     PT Time Calculation (min) 44 min  PROGRESS NOTE DUE ON VISIT 15    Activity Tolerance Patient tolerated treatment well     Behavior During Therapy [facility] for tasks assessed/performed       PT End of Session - 03/03/22 1003     Visit Number 8    Date for PT Re-Evaluation 04/01/22    Authorization Type [employer] Medicare    Authorization Time Period PROGRESS NOTE IS DUE ON VISIT 15    Progress Note Due on Visit 15    PT Start Time 0950    PT Stop Time 1030    PT Time Calculation (min) 40 min    Activity Tolerance Patient tolerated treatment well    Behavior During Therapy [facility] for tasks assessed/performed              PT End of Session - 03/03/22 1003     Visit Number 8    Date for PT Re-Evaluation 04/01/22    Authorization Type [employer] Medicare    Authorization Time Period PROGRESS NOTE IS DUE ON VISIT 15    Progress Note Due on Visit 15    PT Start Time 0950    PT Stop Time 1030    PT Time Calculation (min) 40 min    Activity Tolerance Patient tolerated treatment well    Behavior During Therapy WFL for tasks assessed/performed                 Past Medical History:  Diagnosis Date   Hypercholesterolemia    Hypertension    Hypothyroidism    Past Surgical History:  Procedure Laterality Date   CATARACT EXTRACTION     MENISECTOMY Right 2015   TONSILLECTOMY AND ADENOIDECTOMY     Patient Active Problem List   Diagnosis Date Noted   Precordial pain 12/14/2012    PCP: Blane Ohara, MD  REFERRING PROVIDER: Verdon Cummins, MD  REFERRING DIAG:  M43.06 (ICD-10-CM) - Lumbar spondylolysis  M17.9  (ICD-10-CM) - OA (osteoarthritis) of knee    Rationale for Evaluation and Treatment Rehabilitation  THERAPY DIAG:  Other low back pain  Chronic pain of left knee  Chronic pain of right knee  ONSET DATE: chronic   SUBJECTIVE:                                                                                                                                                                                          "  My LB hurt all day the day after my last session 8-9/10."   SUBJECTIVE STATEMENT: Pt is an 88 y.o. female who presents to PT with chronic LBP and bil knee pain.  Pt has had spinal epidural without relief of symptoms and synovisc injections into bil knees with minimal relief.  Pt reports widespread pain and has had PT ~2 years ago to improve function.  Pt reports that she is here because her daughters want her to work on the pain and improve her function. PERTINENT HISTORY:  Spinal stenosis, bil knee OA, carpal tunnel  PAIN:  Are you having pain? Yes: NPRS scale: Current: 5 /10  min-max 3-8/10 Pain location: bil knees and low back Pain description: sore, intense  Aggravating factors: constant pain, standing/walking Relieving factors: ice, heat, pain medication, sitting down   PRECAUTIONS: Fall  WEIGHT BEARING RESTRICTIONS No  FALLS:  Has patient fallen in last 6 months? No  LIVING ENVIRONMENT: Lives with: lives alone Lives in: House/apartment Stairs: Yes: External: 3 steps; ramp Has following equipment at home: Single point cane, Walker - 2 wheeled, shower chair, Grab bars, and Ramped entry  OCCUPATION: retired    PLOF: Independent with basic ADLs Pt does short distance grocery shopping Lives at the [employer]   PATIENT GOALS reduce pain, improve function, improve balance   OBJECTIVE:   DIAGNOSTIC FINDINGS:  Spinal stenosis and bil knee OA  SCREENING FOR RED FLAGS: Bowel or bladder incontinence: No Spinal tumors: No Cauda equina syndrome: No Compression  fracture: No Abdominal aneurysm: No  COGNITION:  Overall cognitive status: Within functional limits for tasks assessed     SENSATION: WFL  MUSCLE LENGTH: Rt limited by 50%, Lt limited by 25%  POSTURE: rounded shoulders, forward head, and flexed trunk   PALPATION: Diffuse palpable tenderness over bil knees, shoulders and lumbar spine   LUMBAR ROM:   Limited with mobility, not formally tested  Shoulder ROM: limited by 50% with pain in all directions     LOWER EXTREMITY MMT:    MMT Right eval Left eval  Hip flexion 4-/5 4-/5  Hip extension    Hip abduction 4/5 4/5  Hip adduction    Hip internal rotation    Hip external rotation    Knee flexion 4/5 4/5  Knee extension 4/5 4+/5  Ankle dorsiflexion 4+/5 4+/5  Ankle plantarflexion    Ankle inversion    Ankle eversion     (Blank rows = not tested)  Bil shoulder strength: 4/5  FUNCTIONAL TESTS:  5 times sit to stand: 14.66 with use of hands on chair  Timed up and go (TUG): 18.03 with cane  02/03/22: 5x sit to stand: 14 seconds  TUG: 17.87 seconds with walker  GAIT: Distance walked: 50 Assistive device utilized: Single point cane Level of assistance: Modified independence Comments: slow mobility, narrow base of support with scissoring, shortened step length    TODAY'S TREATMENT  Date: 02/27/22 Pt seen for aquatic therapy today.  Treatment took place in water 3.25-4.8 ft in depth at the [facility] pool. Temp of water was 91.  Pt entered/exited the pool via stairs step to pattern cga with bilat rail.   -Walking forward x 6 widths, backward x 4, side stepping x 4 supported by white barbell and cga in 74ft -Standing ue supported on wall: df; pf; marching; add/abd, squats and hip extension x15.  Cues for positioing on bench to maintain sitting balance submerged -Seated on bench. Marching x 20 reps; LAQ R/L x 20; add/abd  x15 -STS from bench SBA onto pool bottom multiple reps. Requires initial min-cga to shift  weight properly  pt with contiuneued tendency to lean back .  She was able 7/12 x  gain immediate standiing balance without leaning on bench.  -stair climbing out of pool with improved weight shift using le to rise rather than UE.  Pt requires buoyancy for support and to offload joints with strengthening exercises. Viscosity of the water is needed for resistance of strengthening; water current perturbations provides challenge to standing balance unsupported, requiring increased core activation.   PATIENT EDUCATION:  Education details: pool info, Code: Acupuncturist Person educated: Patient Education method: Programmer, multimedia, Facilities manager, and Handouts Education comprehension: verbalized understanding and returned demonstration   HOME EXERCISE PROGRAM: Access Code: [email or web address] URL: [email or web address] Date: 02/03/2022 Prepared by: Tresa Endo  Exercises - Sit to Stand  - 2 x daily - 7 x weekly - 2 sets - 5 reps - Seated Hamstring Stretch  - 2 x daily - 7 x weekly - 1 sets - 3 reps - 20 hold - Supine Shoulder Flexion AAROM with Hands Clasped  - 2 x daily - 7 x weekly - 1 sets - 10 reps - 5 hold - Seated Shoulder Flexion  - 2 x daily - 7 x weekly - 1 sets - 10 reps - Seated Isometric Hip Adduction with Ball  - 2 x daily - 7 x weekly - 1 sets - 10 reps - 5 hold - Seated Heel Toe Raises  - 3 x daily - 7 x weekly - 2 sets - 10 reps  ASSESSMENT:  CLINICAL IMPRESSION:   Pt with fair response to last session. Reports spike in pain next day.  Pt cued throughout session to work in pain free or just into minimal pain zone.  Modified session allowing for increased rest periods and eliminated lumbar rotation. She has improved ability to maintain seated position submerged engaging core. Improving weight shift with STS and stair climbing. She is encouraged to get [facility] transport to and from pool to conserve energy She will continue to benefit from aquatic therapy to facilitate progression towards goals and  improve safety in home environment.     OBJECTIVE IMPAIRMENTS Abnormal gait, decreased balance, decreased endurance, decreased mobility, difficulty walking, decreased ROM, impaired flexibility, impaired UE functional use, improper body mechanics, postural dysfunction, and pain.   ACTIVITY LIMITATIONS bending, standing, squatting, stairs, transfers, reach over head, and locomotion level  PARTICIPATION LIMITATIONS: meal prep, cleaning, shopping, and community activity  PERSONAL FACTORS Age, Time since onset of injury/illness/exacerbation, and 1-2 comorbidities: spinal stenosis, chronic LBP, knee OA  are also affecting patient's functional outcome.   REHAB POTENTIAL: Good  CLINICAL DECISION MAKING: Evolving/moderate complexity  EVALUATION COMPLEXITY: Moderate   GOALS: Goals reviewed with patient? Yes  SHORT TERM GOALS: Target date: 03/02/22  Be independent in initial HEP Baseline: Goal status: On going   2.  Report 25% increased ease with reaching overhead with functional tasks  Baseline: limited Shoulder A/ROM and pain with reaching past 90 Goal status: On going   3.  Improve LE strength to stand from chair with min UE support  Baseline: mod UE support (02/03/22) Goal status: revised   4.  Perform TUG in < or = to 15 seconds to improve balance  Baseline:  Goal status: on going     LONG TERM GOALS: Target date: 03/31/22  Be independent in advanced HEP Baseline:  Goal status: In progress   2.  Perform sit to stand with  min UE support due to improved LE strength  Baseline: moderate UE support (02/03/22) Goal status: In progress   3.  Verbalize and demonstrate body mechanics modifications for lumbar and knee protection with functional tasks  Baseline:  Goal status: In progress   4.  Perform TUG in < or = to 13 seconds to improve balance  Baseline:  Goal status: In progress   5.  Report 50% increased ease with reaching overhead with daily tasks  Baseline: 10% increased  ease (02/03/22) Goal status: in progress    6.  Report a 25% reduction in knee and lumbar pain to improve function Baseline: no change (02/03/22)   Goal Status: In progress      PLAN: PT FREQUENCY: 1-2x/wk  PT DURATION: 8 weeks  PLANNED INTERVENTIONS: Therapeutic exercises, Therapeutic activity, Neuromuscular re-education, Balance training, Gait training, Patient/Family education, Joint mobilization, Stair training, Aquatic Therapy, Dry Needling, Electrical stimulation, Spinal manipulation, Spinal mobilization, Cryotherapy, Moist heat, Taping, and Manual therapy.  PLAN FOR NEXT SESSION: aquatic PT to strengthen muscles and improve mobility, reduce pain    Corrie Dandy (Frankie) Haizel Gatchell MPT 03/03/22 1:11 PM

## 2022-03-11 ENCOUNTER — Ambulatory Visit (HOSPITAL_BASED_OUTPATIENT_CLINIC_OR_DEPARTMENT_OTHER): Payer: Medicare HMO | Admitting: Physical Therapy

## 2022-03-11 ENCOUNTER — Encounter (HOSPITAL_BASED_OUTPATIENT_CLINIC_OR_DEPARTMENT_OTHER): Payer: Self-pay | Admitting: Physical Therapy

## 2022-03-11 DIAGNOSIS — M5459 Other low back pain: Secondary | ICD-10-CM | POA: Diagnosis not present

## 2022-03-11 DIAGNOSIS — G8929 Other chronic pain: Secondary | ICD-10-CM | POA: Diagnosis not present

## 2022-03-11 DIAGNOSIS — M25562 Pain in left knee: Secondary | ICD-10-CM | POA: Diagnosis not present

## 2022-03-11 DIAGNOSIS — M25561 Pain in right knee: Secondary | ICD-10-CM | POA: Diagnosis not present

## 2022-03-11 DIAGNOSIS — R2689 Other abnormalities of gait and mobility: Secondary | ICD-10-CM | POA: Diagnosis not present

## 2022-03-11 NOTE — Therapy (Signed)
OUTPATIENT PHYSICAL THERAPY THORACOLUMBAR   03/11/22 3:09 PM  OUTPATIENT PHYSICAL THERAPY THORACOLUMBAR    Patient Name: Shelia Jimenez MRN: [id number] DOB:09-27-1932, 88 y.o., female Today's Date: 03/11/2022    PT End of Session - 01/29/22 1116       Visit Number 5    Date for PT Re-Evaluation 04/01/22    Authorization Type [employer]     Progress Note Due on Visit 10     PT Start Time 1116     PT Stop Time 1200     PT Time Calculation (min) 44 min  PROGRESS NOTE DUE ON VISIT 15    Activity Tolerance Patient tolerated treatment well     Behavior During Therapy WFL for tasks assessed/performed                Past Medical History:  Diagnosis Date   Hypercholesterolemia    Hypertension    Hypothyroidism    Past Surgical History:  Procedure Laterality Date   CATARACT EXTRACTION     MENISECTOMY Right 2015   TONSILLECTOMY AND ADENOIDECTOMY     Patient Active Problem List   Diagnosis Date Noted   Precordial pain 12/14/2012    PCP: Blane Ohara, MD  REFERRING PROVIDER: Verdon Cummins, MD  REFERRING DIAG:  M43.06 (ICD-10-CM) - Lumbar spondylolysis  M17.9 (ICD-10-CM) - OA (osteoarthritis) of knee    Rationale for Evaluation and Treatment Rehabilitation  THERAPY DIAG:  No diagnosis found.  ONSET DATE: chronic   SUBJECTIVE:                                                                                                                                                                                          "I feel good today"   SUBJECTIVE STATEMENT: Pt is an 88 y.o. female who presents to PT with chronic LBP and bil knee pain.  Pt has had spinal epidural without relief of symptoms and synovisc injections into bil knees with minimal relief.  Pt reports widespread pain and has had PT ~2 years ago to improve function.  Pt reports that she is here because her daughters want her to work on the pain and improve her function. PERTINENT HISTORY:  Spinal  stenosis, bil knee OA, carpal tunnel  PAIN:  Are you having pain? Yes: NPRS scale: Current: 5 /10  min-max 3-8/10 Pain location: bil knees and low back Pain description: sore, intense  Aggravating factors: constant pain, standing/walking Relieving factors: ice, heat, pain medication, sitting down   PRECAUTIONS: Fall  WEIGHT BEARING RESTRICTIONS No  FALLS:  Has patient fallen in last 6 months? No  LIVING ENVIRONMENT: Lives with: lives  alone Lives in: House/apartment Stairs: Yes: External: 3 steps; ramp Has following equipment at home: Single point cane, Walker - 2 wheeled, shower chair, Grab bars, and Ramped entry  OCCUPATION: retired    PLOF: Independent with basic ADLs Pt does short distance grocery shopping Lives at the [facility]   PATIENT GOALS reduce pain, improve function, improve balance   OBJECTIVE:   DIAGNOSTIC FINDINGS:  Spinal stenosis and bil knee OA  SCREENING FOR RED FLAGS: Bowel or bladder incontinence: No Spinal tumors: No Cauda equina syndrome: No Compression fracture: No Abdominal aneurysm: No  COGNITION:  Overall cognitive status: Within functional limits for tasks assessed     SENSATION: WFL  MUSCLE LENGTH: Rt limited by 50%, Lt limited by 25%  POSTURE: rounded shoulders, forward head, and flexed trunk   PALPATION: Diffuse palpable tenderness over bil knees, shoulders and lumbar spine   LUMBAR ROM:   Limited with mobility, not formally tested  Shoulder ROM: limited by 50% with pain in all directions     LOWER EXTREMITY MMT:    MMT Right eval Left eval  Hip flexion 4-/5 4-/5  Hip extension    Hip abduction 4/5 4/5  Hip adduction    Hip internal rotation    Hip external rotation    Knee flexion 4/5 4/5  Knee extension 4/5 4+/5  Ankle dorsiflexion 4+/5 4+/5  Ankle plantarflexion    Ankle inversion    Ankle eversion     (Blank rows = not tested)  Bil shoulder strength: 4/5  FUNCTIONAL TESTS:  5 times sit to stand:  14.66 with use of hands on chair  Timed up and go (TUG): 18.03 with cane  02/03/22: 5x sit to stand: 14 seconds  TUG: 17.87 seconds with walker  GAIT: Distance walked: 50 Assistive device utilized: Single point cane Level of assistance: Modified independence Comments: slow mobility, narrow base of support with scissoring, shortened step length    TODAY'S TREATMENT  Date: 02/27/22 Pt seen for aquatic therapy today.  Treatment took place in water 3.25-4.8 ft in depth at the [facility] pool. Temp of water was 91.  Pt entered/exited the pool via stairs step to pattern cga with bilat rail. Used WC to and from pool  -Walking forward x 4 widths, backward x 2, side stepping x 2 supported by white barbell and cga in 3.69ft -Standing ue supported on wall: df; pf; marching; add/abd, squats and hip extension x15.   -Seated on bench. Marching x 20 reps; LAQ R/L x 20;  -Marching forward and backward with white barbell. -Side stepping with knee bends (modified lunge) ue support white barbell -UE row on surface x 20 white barbell; tricep press barbell into water     Pt requires buoyancy for support and to offload joints with strengthening exercises. Viscosity of the water is needed for resistance of strengthening; water current perturbations provides challenge to standing balance unsupported, requiring increased core activation.   PATIENT EDUCATION:  Education details: pool info, Code: Acupuncturist Person educated: Patient Education method: Programmer, multimedia, Facilities manager, and Handouts Education comprehension: verbalized understanding and returned demonstration   HOME EXERCISE PROGRAM: Access Code: [email or web address] URL: [email or web address] Date: 02/03/2022 Prepared by: Tresa Endo  Exercises - Sit to Stand  - 2 x daily - 7 x weekly - 2 sets - 5 reps - Seated Hamstring Stretch  - 2 x daily - 7 x weekly - 1 sets - 3 reps - 20 hold - Supine Shoulder Flexion AAROM with Hands Clasped  - 2 x  daily -  7 x weekly - 1 sets - 10 reps - 5 hold - Seated Shoulder Flexion  - 2 x daily - 7 x weekly - 1 sets - 10 reps - Seated Isometric Hip Adduction with Ball  - 2 x daily - 7 x weekly - 1 sets - 10 reps - 5 hold - Seated Heel Toe Raises  - 3 x daily - 7 x weekly - 2 sets - 10 reps  ASSESSMENT:  CLINICAL IMPRESSION:   Pt reports decreased pain today. She completes session with therapist on deck part of session. She demonstrates improved confidence with improving core strength walking indep in 3 ft in all direction holding to white barbell.  She sits indep on bench for a short time.  Once she loses ability to maintain seated position she requires manual assist  the remainder of time spent sitting. We did trial a thin noodle to increase core engagement with standing and amb but pt unable to gain standing balance with its use. She engages core more today with increased core engagement with activities and does report some irritation in LB upon completion. Goals ongoing Plan: reaching activities/Shoulder engagement      OBJECTIVE IMPAIRMENTS Abnormal gait, decreased balance, decreased endurance, decreased mobility, difficulty walking, decreased ROM, impaired flexibility, impaired UE functional use, improper body mechanics, postural dysfunction, and pain.   ACTIVITY LIMITATIONS bending, standing, squatting, stairs, transfers, reach over head, and locomotion level  PARTICIPATION LIMITATIONS: meal prep, cleaning, shopping, and community activity  PERSONAL FACTORS Age, Time since onset of injury/illness/exacerbation, and 1-2 comorbidities: spinal stenosis, chronic LBP, knee OA  are also affecting patient's functional outcome.   REHAB POTENTIAL: Good  CLINICAL DECISION MAKING: Evolving/moderate complexity  EVALUATION COMPLEXITY: Moderate   GOALS: Goals reviewed with patient? Yes  SHORT TERM GOALS: Target date: 03/02/22  Be independent in initial HEP Baseline: Goal status: On going   2.   Report 25% increased ease with reaching overhead with functional tasks  Baseline: limited Shoulder A/ROM and pain with reaching past 90 Goal status: On going   3.  Improve LE strength to stand from chair with min UE support  Baseline: mod UE support (02/03/22) Goal status: revised   4.  Perform TUG in < or = to 15 seconds to improve balance  Baseline:  Goal status: on going     LONG TERM GOALS: Target date: 03/31/22  Be independent in advanced HEP Baseline:  Goal status: In progress   2.  Perform sit to stand with min UE support due to improved LE strength  Baseline: moderate UE support (02/03/22) Goal status: In progress   3.  Verbalize and demonstrate body mechanics modifications for lumbar and knee protection with functional tasks  Baseline:  Goal status: In progress   4.  Perform TUG in < or = to 13 seconds to improve balance  Baseline:  Goal status: In progress   5.  Report 50% increased ease with reaching overhead with daily tasks  Baseline: 10% increased ease (02/03/22) Goal status: in progress    6.  Report a 25% reduction in knee and lumbar pain to improve function Baseline: no change (02/03/22)   Goal Status: In progress      PLAN: PT FREQUENCY: 1-2x/wk  PT DURATION: 8 weeks  PLANNED INTERVENTIONS: Therapeutic exercises, Therapeutic activity, Neuromuscular re-education, Balance training, Gait training, Patient/Family education, Joint mobilization, Stair training, Aquatic Therapy, Dry Needling, Electrical stimulation, Spinal manipulation, Spinal mobilization, Cryotherapy, Moist heat, Taping, and Manual therapy.  PLAN FOR NEXT  SESSION: aquatic PT to strengthen muscles and improve mobility, reduce pain    Rushie Chestnut) Brianah Hopson MPT 03/11/22 12:47 PM

## 2022-03-18 ENCOUNTER — Ambulatory Visit (HOSPITAL_BASED_OUTPATIENT_CLINIC_OR_DEPARTMENT_OTHER): Payer: Medicare HMO | Admitting: Physical Therapy

## 2022-03-18 ENCOUNTER — Encounter (HOSPITAL_BASED_OUTPATIENT_CLINIC_OR_DEPARTMENT_OTHER): Payer: Self-pay | Admitting: Physical Therapy

## 2022-03-18 DIAGNOSIS — M25562 Pain in left knee: Secondary | ICD-10-CM | POA: Diagnosis not present

## 2022-03-18 DIAGNOSIS — R2689 Other abnormalities of gait and mobility: Secondary | ICD-10-CM

## 2022-03-18 DIAGNOSIS — M25561 Pain in right knee: Secondary | ICD-10-CM | POA: Diagnosis not present

## 2022-03-18 DIAGNOSIS — M5459 Other low back pain: Secondary | ICD-10-CM | POA: Diagnosis not present

## 2022-03-18 DIAGNOSIS — G8929 Other chronic pain: Secondary | ICD-10-CM

## 2022-03-18 NOTE — Therapy (Signed)
OUTPATIENT PHYSICAL THERAPY THORACOLUMBAR   03/18/22 11:34 AM  OUTPATIENT PHYSICAL THERAPY THORACOLUMBAR    Patient Name: Shelia Jimenez MRN: [id number] DOB:1933-04-27, 88 y.o., female Today's Date: 03/18/2022    PT End of Session - 01/29/22 1116       Visit Number 5    Date for PT Re-Evaluation 04/01/22    Authorization Type [employer]     Progress Note Due on Visit 10     PT Start Time 1116     PT Stop Time 1200     PT Time Calculation (min) 44 min  PROGRESS NOTE DUE ON VISIT 15    Activity Tolerance Patient tolerated treatment well     Behavior During Therapy WFL for tasks assessed/performed                Past Medical History:  Diagnosis Date   Hypercholesterolemia    Hypertension    Hypothyroidism    Past Surgical History:  Procedure Laterality Date   CATARACT EXTRACTION     MENISECTOMY Right 2015   TONSILLECTOMY AND ADENOIDECTOMY     Patient Active Problem List   Diagnosis Date Noted   Precordial pain 12/14/2012    PCP: Rochel Brome, MD  REFERRING PROVIDER: Margaretha Sheffield, MD  REFERRING DIAG:  M43.06 (ICD-10-CM) - Lumbar spondylolysis  M17.9 (ICD-10-CM) - OA (osteoarthritis) of knee    Rationale for Evaluation and Treatment Rehabilitation  THERAPY DIAG:  No diagnosis found.  ONSET DATE: chronic   SUBJECTIVE:                                                                                                                                                                                          "I woke up this morning with my vision a little fuzzy otherwise I have had a few good days"   SUBJECTIVE STATEMENT: Pt is an 88 y.o. female who presents to PT with chronic LBP and bil knee pain.  Pt has had spinal epidural without relief of symptoms and synovisc injections into bil knees with minimal relief.  Pt reports widespread pain and has had PT ~2 years ago to improve function.  Pt reports that she is here because her daughters want her to  work on the pain and improve her function. PERTINENT HISTORY:  Spinal stenosis, bil knee OA, carpal tunnel  PAIN:  Are you having pain? Yes: NPRS scale: Current: 5 /10  min-max 3-8/10 Pain location: bil knees and low back Pain description: sore, intense  Aggravating factors: constant pain, standing/walking Relieving factors: ice, heat, pain medication, sitting down   PRECAUTIONS: Fall  WEIGHT BEARING RESTRICTIONS No  FALLS:  Has patient fallen in last 6 months? No  LIVING ENVIRONMENT: Lives with: lives alone Lives in: House/apartment Stairs: Yes: External: 3 steps; ramp Has following equipment at home: Single point cane, Walker - 2 wheeled, shower chair, Grab bars, and Ramped entry  OCCUPATION: retired    PLOF: Independent with basic ADLs Pt does short distance grocery shopping Lives at the [employer]   PATIENT GOALS reduce pain, improve function, improve balance   OBJECTIVE:   DIAGNOSTIC FINDINGS:  Spinal stenosis and bil knee OA  SCREENING FOR RED FLAGS: Bowel or bladder incontinence: No Spinal tumors: No Cauda equina syndrome: No Compression fracture: No Abdominal aneurysm: No  COGNITION:  Overall cognitive status: Within functional limits for tasks assessed     SENSATION: WFL  MUSCLE LENGTH: Rt limited by 50%, Lt limited by 25%  POSTURE: rounded shoulders, forward head, and flexed trunk   PALPATION: Diffuse palpable tenderness over bil knees, shoulders and lumbar spine   LUMBAR ROM:   Limited with mobility, not formally tested  Shoulder ROM: limited by 50% with pain in all directions     LOWER EXTREMITY MMT:    MMT Right eval Left eval  Hip flexion 4-/5 4-/5  Hip extension    Hip abduction 4/5 4/5  Hip adduction    Hip internal rotation    Hip external rotation    Knee flexion 4/5 4/5  Knee extension 4/5 4+/5  Ankle dorsiflexion 4+/5 4+/5  Ankle plantarflexion    Ankle inversion    Ankle eversion     (Blank rows = not tested)  Bil  shoulder strength: 4/5  FUNCTIONAL TESTS:  5 times sit to stand: 14.66 with use of hands on chair  Timed up and go (TUG): 18.03 with cane  02/03/22: 5x sit to stand: 14 seconds  TUG: 17.87 seconds with walker  GAIT: Distance walked: 50 Assistive device utilized: Single point cane Level of assistance: Modified independence Comments: slow mobility, narrow base of support with scissoring, shortened step length    TODAY'S TREATMENT  Date: 03/18/22 Pt seen for aquatic therapy today.  Treatment took place in water 3.25-4.8 ft in depth at the [facility] pool. Temp of water was 91.  Pt entered/exited the pool via stairs step to pattern cga with bilat rail. Used WC to and from pool BP 130/80 Pulse 88 O2 sats 98  -Walking forward x 4 widths, backward x 2, side stepping x 2 supported by white barbell and cga in 3.67ft -Standing ue supported on wall: df; pf; marching; add/abd, squats and hip extension x15.   -Seated on bench. Marching x 20 reps; LAQ R/L x 20;  cycling x20.PT assisting with verbal and TC to  maintain seated position on bench -STS from bench feet on floor x 5; onto water step x7. TC/min assist for weight shift.  VC for execution and use of hands.    Pt requires buoyancy for support and to offload joints with strengthening exercises. Viscosity of the water is needed for resistance of strengthening; water current perturbations provides challenge to standing balance unsupported, requiring increased core activation.   PATIENT EDUCATION:  Education details: pool info, Code: Acupuncturist Person educated: Patient Education method: Programmer, multimedia, Facilities manager, and Handouts Education comprehension: verbalized understanding and returned demonstration   HOME EXERCISE PROGRAM: Access Code: [email or web address] URL: [email or web address] Date: 02/03/2022 Prepared by: Tresa Endo  Exercises - Sit to Stand  - 2 x daily - 7 x weekly - 2 sets - 5 reps - Seated Hamstring Stretch  - 2  x daily - 7 x weekly - 1 sets - 3 reps - 20 hold - Supine Shoulder Flexion AAROM with Hands Clasped  - 2 x daily - 7 x weekly - 1 sets - 10 reps - 5 hold - Seated Shoulder Flexion  - 2 x daily - 7 x weekly - 1 sets - 10 reps - Seated Isometric Hip Adduction with Ball  - 2 x daily - 7 x weekly - 1 sets - 10 reps - 5 hold - Seated Heel Toe Raises  - 3 x daily - 7 x weekly - 2 sets - 10 reps  ASSESSMENT:  CLINICAL IMPRESSION:   Pt reports some fuzziness in head upon waking.  She took her BP and said it was normal. Vitals taken here and are WNL.  We decided to begin session to test toleration in which she had no difficulty.  Therapist in water with pt entire session for added safety.  She is able to maintain seated position on bench with improvement finding COB with less difficult.  She has had a noticeable improvement in her strength stating she feels like she walk better and longer. Reports head is clear upon completion of session.  She will continue to benefit form aquatic therapy using the properties of water to progress towards meeting goals as she is unable to tolerate sessions on land.      OBJECTIVE IMPAIRMENTS Abnormal gait, decreased balance, decreased endurance, decreased mobility, difficulty walking, decreased ROM, impaired flexibility, impaired UE functional use, improper body mechanics, postural dysfunction, and pain.   ACTIVITY LIMITATIONS bending, standing, squatting, stairs, transfers, reach over head, and locomotion level  PARTICIPATION LIMITATIONS: meal prep, cleaning, shopping, and community activity  PERSONAL FACTORS Age, Time since onset of injury/illness/exacerbation, and 1-2 comorbidities: spinal stenosis, chronic LBP, knee OA  are also affecting patient's functional outcome.   REHAB POTENTIAL: Good  CLINICAL DECISION MAKING: Evolving/moderate complexity  EVALUATION COMPLEXITY: Moderate   GOALS: Goals reviewed with patient? Yes  SHORT TERM GOALS: Target date:  03/02/22  Be independent in initial HEP Baseline: Goal status: On going   2.  Report 25% increased ease with reaching overhead with functional tasks  Baseline: limited Shoulder A/ROM and pain with reaching past 90 Goal status: On going   3.  Improve LE strength to stand from chair with min UE support  Baseline: mod UE support (02/03/22) Goal status: revised   4.  Perform TUG in < or = to 15 seconds to improve balance  Baseline:  Goal status: on going     LONG TERM GOALS: Target date: 03/31/22  Be independent in advanced HEP Baseline:  Goal status: In progress   2.  Perform sit to stand with min UE support due to improved LE strength  Baseline: moderate UE support (02/03/22) Goal status: In progress   3.  Verbalize and demonstrate body mechanics modifications for lumbar and knee protection with functional tasks  Baseline:  Goal status: In progress   4.  Perform TUG in < or = to 13 seconds to improve balance  Baseline:  Goal status: In progress   5.  Report 50% increased ease with reaching overhead with daily tasks  Baseline: 10% increased ease (02/03/22) Goal status: in progress    6.  Report a 25% reduction in knee and lumbar pain to improve function Baseline: no change (02/03/22)   Goal Status: In progress      PLAN: PT FREQUENCY: 1-2x/wk  PT DURATION: 8 weeks  PLANNED INTERVENTIONS: Therapeutic  exercises, Therapeutic activity, Neuromuscular re-education, Balance training, Gait training, Patient/Family education, Joint mobilization, Stair training, Aquatic Therapy, Dry Needling, Electrical stimulation, Spinal manipulation, Spinal mobilization, Cryotherapy, Moist heat, Taping, and Manual therapy.  PLAN FOR NEXT SESSION: aquatic PT to strengthen muscles and improve mobility, reduce pain    Rushie Chestnut) Jourdon Zimmerle MPT 03/18/22 11:34 AM

## 2022-03-25 ENCOUNTER — Ambulatory Visit (HOSPITAL_BASED_OUTPATIENT_CLINIC_OR_DEPARTMENT_OTHER): Payer: Medicare HMO | Admitting: Physical Therapy

## 2022-03-25 ENCOUNTER — Encounter (HOSPITAL_BASED_OUTPATIENT_CLINIC_OR_DEPARTMENT_OTHER): Payer: Self-pay | Admitting: Physical Therapy

## 2022-03-25 DIAGNOSIS — M25561 Pain in right knee: Secondary | ICD-10-CM | POA: Diagnosis not present

## 2022-03-25 DIAGNOSIS — G8929 Other chronic pain: Secondary | ICD-10-CM

## 2022-03-25 DIAGNOSIS — R2689 Other abnormalities of gait and mobility: Secondary | ICD-10-CM | POA: Diagnosis not present

## 2022-03-25 DIAGNOSIS — M5459 Other low back pain: Secondary | ICD-10-CM

## 2022-03-25 DIAGNOSIS — M25562 Pain in left knee: Secondary | ICD-10-CM | POA: Diagnosis not present

## 2022-03-25 NOTE — Therapy (Signed)
OUTPATIENT PHYSICAL THERAPY THORACOLUMBAR   03/25/22 6:26 PM  OUTPATIENT PHYSICAL THERAPY THORACOLUMBAR    Patient Name: Shelia Jimenez MRN: [id number] DOB:1932-11-02, 88 y.o., female Today's Date: 03/25/2022      PT End of Session - 03/25/22 1101     Visit Number 11    Number of Visits 16    Date for PT Re-Evaluation 04/01/22    Authorization Type [employer] Medicare    Authorization Time Period PROGRESS NOTE IS DUE ON VISIT 15    Progress Note Due on Visit 15    PT Start Time 1115    PT Stop Time 1200    PT Time Calculation (min) 45 min    Activity Tolerance Patient tolerated treatment well    Behavior During Therapy [facility] for tasks assessed/performed               PT End of Session - 03/25/22 1101     Visit Number 11    Number of Visits 16    Date for PT Re-Evaluation 04/01/22    Authorization Type [employer] Medicare    Authorization Time Period PROGRESS NOTE IS DUE ON VISIT 15    Progress Note Due on Visit 15    PT Start Time 1115    PT Stop Time 1200    PT Time Calculation (min) 45 min    Activity Tolerance Patient tolerated treatment well    Behavior During Therapy WFL for tasks assessed/performed                  Past Medical History:  Diagnosis Date   Hypercholesterolemia    Hypertension    Hypothyroidism    Past Surgical History:  Procedure Laterality Date   CATARACT EXTRACTION     MENISECTOMY Right 2015   TONSILLECTOMY AND ADENOIDECTOMY     Patient Active Problem List   Diagnosis Date Noted   Precordial pain 12/14/2012    PCP: Blane Ohara, MD  REFERRING PROVIDER: Verdon Cummins, MD  REFERRING DIAG:  M43.06 (ICD-10-CM) - Lumbar spondylolysis  M17.9 (ICD-10-CM) - OA (osteoarthritis) of knee    Rationale for Evaluation and Treatment Rehabilitation  THERAPY DIAG:  Other low back pain  Chronic pain of left knee  Chronic pain of right knee  Other abnormalities of gait and mobility  ONSET DATE: chronic   SUBJECTIVE:                                                                                                                                                                                           "I think I am a little better"   SUBJECTIVE STATEMENT: Pt is  an 88 y.o. female who presents to PT with chronic LBP and bil knee pain.  Pt has had spinal epidural without relief of symptoms and synovisc injections into bil knees with minimal relief.  Pt reports widespread pain and has had PT ~2 years ago to improve function.  Pt reports that she is here because her daughters want her to work on the pain and improve her function. PERTINENT HISTORY:  Spinal stenosis, bil knee OA, carpal tunnel  PAIN:  Are you having pain? Yes: NPRS scale: Current: 5 /10  min-max 3-8/10 Pain location: bil knees and low back Pain description: sore, intense  Aggravating factors: constant pain, standing/walking Relieving factors: ice, heat, pain medication, sitting down   PRECAUTIONS: Fall  WEIGHT BEARING RESTRICTIONS No  FALLS:  Has patient fallen in last 6 months? No  LIVING ENVIRONMENT: Lives with: lives alone Lives in: House/apartment Stairs: Yes: External: 3 steps; ramp Has following equipment at home: Single point cane, Walker - 2 wheeled, shower chair, Grab bars, and Ramped entry  OCCUPATION: retired    PLOF: Independent with basic ADLs Pt does short distance grocery shopping Lives at the [facility]   PATIENT GOALS reduce pain, improve function, improve balance   OBJECTIVE:   DIAGNOSTIC FINDINGS:  Spinal stenosis and bil knee OA  SCREENING FOR RED FLAGS: Bowel or bladder incontinence: No Spinal tumors: No Cauda equina syndrome: No Compression fracture: No Abdominal aneurysm: No  COGNITION:  Overall cognitive status: Within functional limits for tasks assessed     SENSATION: WFL  MUSCLE LENGTH: Rt limited by 50%, Lt limited by 25%  POSTURE: rounded shoulders, forward head, and flexed trunk    PALPATION: Diffuse palpable tenderness over bil knees, shoulders and lumbar spine   LUMBAR ROM:   Limited with mobility, not formally tested  Shoulder ROM: limited by 50% with pain in all directions     LOWER EXTREMITY MMT:    MMT Right eval Left eval  Hip flexion 4-/5 4-/5  Hip extension    Hip abduction 4/5 4/5  Hip adduction    Hip internal rotation    Hip external rotation    Knee flexion 4/5 4/5  Knee extension 4/5 4+/5  Ankle dorsiflexion 4+/5 4+/5  Ankle plantarflexion    Ankle inversion    Ankle eversion     (Blank rows = not tested)  Bil shoulder strength: 4/5  FUNCTIONAL TESTS:  5 times sit to stand: 14.66 with use of hands on chair  Timed up and go (TUG): 18.03 with cane  02/03/22: 5x sit to stand: 14 seconds  TUG: 17.87 seconds with walker  GAIT: Distance walked: 50 Assistive device utilized: Single point cane Level of assistance: Modified independence Comments: slow mobility, narrow base of support with scissoring, shortened step length    TODAY'S TREATMENT  Date: 03/25/22 Pt seen for aquatic therapy today.  Treatment took place in water 3.25-4.8 ft in depth at the [facility] pool. Temp of water was 91.  Pt entered/exited the pool via stairs step to pattern cga with bilat rail. Used WC to and from pool  -Walking forward x 4 widths, backward x 2, side stepping x 2 supported by white barbell and sba-sup in 3.30ft -Standing ue supported on wall: df; pf; marching; add/abd, squats and hip extension x15.   -lumbar and thoracic stretching:L stretch using 3rd step and holding to hand rails with mod assist into position and verbal/tactile cues for execution. -Standing lumbar rotation  -Seated on bench. Marching 2x 20 reps;  LAQ R/L 2x 20;  cycling 3x20. -STS from bench feet on floor 2x 5 hha with cues for weight shift and rising without pulling on my hands.  Manual:TPM paraspinal mid thoracic area    Pt requires buoyancy for support and to  offload joints with strengthening exercises. Viscosity of the water is needed for resistance of strengthening; water current perturbations provides challenge to standing balance unsupported, requiring increased core activation.   PATIENT EDUCATION:  Education details: pool info, Code: Acupuncturist Person educated: Patient Education method: Programmer, multimedia, Facilities manager, and Handouts Education comprehension: verbalized understanding and returned demonstration   HOME EXERCISE PROGRAM: Access Code: [email or web address] URL: [email or web address] Date: 02/03/2022 Prepared by: Tresa Endo  Exercises - Sit to Stand  - 2 x daily - 7 x weekly - 2 sets - 5 reps - Seated Hamstring Stretch  - 2 x daily - 7 x weekly - 1 sets - 3 reps - 20 hold - Supine Shoulder Flexion AAROM with Hands Clasped  - 2 x daily - 7 x weekly - 1 sets - 10 reps - 5 hold - Seated Shoulder Flexion  - 2 x daily - 7 x weekly - 1 sets - 10 reps - Seated Isometric Hip Adduction with Ball  - 2 x daily - 7 x weekly - 1 sets - 10 reps - 5 hold - Seated Heel Toe Raises  - 3 x daily - 7 x weekly - 2 sets - 10 reps  ASSESSMENT:  CLINICAL IMPRESSION:   Pt continues to need assistance of therapist in water to assist with sitting exercises (keeping pt in upright position) and for safety. She reports good response from last session but continues to have mid thoracic and LBP. PT able to decreases some discomfort using deep pressure point massage (right paraspinals mid thoracic area). Increased effort to stretch middle to lower back with some success varying position of L stretch on steps and handrails with mod assist into position.  Recert to be completed next visit     OBJECTIVE IMPAIRMENTS Abnormal gait, decreased balance, decreased endurance, decreased mobility, difficulty walking, decreased ROM, impaired flexibility, impaired UE functional use, improper body mechanics, postural dysfunction, and pain.   ACTIVITY LIMITATIONS bending, standing,  squatting, stairs, transfers, reach over head, and locomotion level  PARTICIPATION LIMITATIONS: meal prep, cleaning, shopping, and community activity  PERSONAL FACTORS Age, Time since onset of injury/illness/exacerbation, and 1-2 comorbidities: spinal stenosis, chronic LBP, knee OA  are also affecting patient's functional outcome.   REHAB POTENTIAL: Good  CLINICAL DECISION MAKING: Evolving/moderate complexity  EVALUATION COMPLEXITY: Moderate   GOALS: Goals reviewed with patient? Yes  SHORT TERM GOALS: Target date: 03/02/22  Be independent in initial HEP Baseline: Goal status: On going   2.  Report 25% increased ease with reaching overhead with functional tasks  Baseline: limited Shoulder A/ROM and pain with reaching past 90 Goal status: On going   3.  Improve LE strength to stand from chair with min UE support  Baseline: mod UE support (02/03/22) Goal status: revised   4.  Perform TUG in < or = to 15 seconds to improve balance  Baseline:  Goal status: on going     LONG TERM GOALS: Target date: 03/31/22  Be independent in advanced HEP Baseline:  Goal status: In progress   2.  Perform sit to stand with min UE support due to improved LE strength  Baseline: moderate UE support (02/03/22) Goal status: In progress   3.  Verbalize and demonstrate body mechanics modifications for lumbar  and knee protection with functional tasks  Baseline:  Goal status: In progress   4.  Perform TUG in < or = to 13 seconds to improve balance  Baseline:  Goal status: In progress   5.  Report 50% increased ease with reaching overhead with daily tasks  Baseline: 10% increased ease (02/03/22) Goal status: in progress    6.  Report a 25% reduction in knee and lumbar pain to improve function Baseline: no change (02/03/22)   Goal Status: In progress      PLAN: PT FREQUENCY: 1-2x/wk  PT DURATION: 8 weeks  PLANNED INTERVENTIONS: Therapeutic exercises, Therapeutic activity, Neuromuscular  re-education, Balance training, Gait training, Patient/Family education, Joint mobilization, Stair training, Aquatic Therapy, Dry Needling, Electrical stimulation, Spinal manipulation, Spinal mobilization, Cryotherapy, Moist heat, Taping, and Manual therapy.  PLAN FOR NEXT SESSION: aquatic PT to strengthen muscles and improve mobility, reduce pain    Annamarie Major) Nyaire Denbleyker MPT 03/25/22 6:26 PM

## 2022-03-29 NOTE — Therapy (Signed)
OUTPATIENT PHYSICAL THERAPY THORACOLUMBAR   PHYSICAL THERAPY DISCHARGE SUMMARY  Visits from Start of Care: 12  Current functional level related to goals / functional outcomes: Pt is as safe and indep with ADL's and functional mobility as able   Remaining deficits: Pain    Education / Equipment: HEP, management of condition   Patient agrees to discharge. Patient goals were partially met. Patient is being discharged due to meeting the stated rehab goals.      Patient Name: Shelia Jimenez MRN: [id number] DOB:1932/08/04, 88 y.o., female Today's Date: 03/30/2022      PT End of Session - 03/30/22 1038     Visit Number 12    Number of Visits 16    Date for PT Re-Evaluation 04/01/22    Authorization Type A[employer] Medicare    Authorization Time Period PROGRESS NOTE IS DUE ON VISIT 22    Progress Note Due on Visit 22    PT Start Time 1136    PT Stop Time 1215    PT Time Calculation (min) 39 min    Activity Tolerance Patient tolerated treatment well    Behavior During Therapy [facility] for tasks assessed/performed                PT End of Session - 03/30/22 1038     Visit Number 12    Number of Visits 16    Date for PT Re-Evaluation 04/01/22    Authorization Type A[employer] Medicare    Authorization Time Period PROGRESS NOTE IS DUE ON VISIT 22    Progress Note Due on Visit 26    PT Start Time 1136    PT Stop Time 1215    PT Time Calculation (min) 39 min    Activity Tolerance Patient tolerated treatment well    Behavior During Therapy WFL for tasks assessed/performed                   Past Medical History:  Diagnosis Date   Hypercholesterolemia    Hypertension    Hypothyroidism    Past Surgical History:  Procedure Laterality Date   CATARACT EXTRACTION     MENISECTOMY Right 2015   TONSILLECTOMY AND ADENOIDECTOMY     Patient Active Problem List   Diagnosis Date Noted   Precordial pain 12/14/2012    PCP: Rochel Brome, MD  REFERRING PROVIDER: Margaretha Sheffield, MD  REFERRING DIAG:  M43.06 (ICD-10-CM) - Lumbar spondylolysis  M17.9 (ICD-10-CM) - OA (osteoarthritis) of knee    Rationale for Evaluation and Treatment Rehabilitation  THERAPY DIAG:  Other low back pain  Chronic pain of left knee  Chronic pain of right knee  Other abnormalities of gait and mobility  ONSET DATE: chronic   SUBJECTIVE:                                                                                                                                                                                          "  I'm not sure this is helping me"   SUBJECTIVE STATEMENT: Pt is an 88 y.o. female who presents to PT with chronic LBP and bil knee pain.  Pt has had spinal epidural without relief of symptoms and synovisc injections into bil knees with minimal relief.  Pt reports widespread pain and has had PT ~2 years ago to improve function.  Pt reports that she is here because her daughters want her to work on the pain and improve her function. PERTINENT HISTORY:  Spinal stenosis, bil knee OA, carpal tunnel  PAIN:  Are you having pain? Yes: NPRS scale: Current: 5 /10  min-max 3-8/10 Pain location: bil knees and low back Pain description: sore, intense  Aggravating factors: constant pain, standing/walking Relieving factors: ice, heat, pain medication, sitting down   PRECAUTIONS: Fall  WEIGHT BEARING RESTRICTIONS No  FALLS:  Has patient fallen in last 6 months? No  LIVING ENVIRONMENT: Lives with: lives alone Lives in: House/apartment Stairs: Yes: External: 3 steps; ramp Has following equipment at home: Single point cane, Walker - 2 wheeled, shower chair, Grab bars, and Ramped entry  OCCUPATION: retired    PLOF: Independent with basic ADLs Pt does short distance grocery shopping Lives at the [facility] reduce pain, improve function, improve balance   OBJECTIVE:   DIAGNOSTIC FINDINGS:  Spinal stenosis and bil knee OA  SCREENING FOR RED  FLAGS: Bowel or bladder incontinence: No Spinal tumors: No Cauda equina syndrome: No Compression fracture: No Abdominal aneurysm: No  COGNITION:  Overall cognitive status: Within functional limits for tasks assessed     SENSATION: WFL  MUSCLE LENGTH: Rt limited by 50%, Lt limited by 25%  POSTURE: rounded shoulders, forward head, and flexed trunk   PALPATION: Diffuse palpable tenderness over bil knees, shoulders and lumbar spine   LUMBAR ROM:   Limited with mobility, not formally tested  Shoulder ROM: limited by 50% with pain in all directions     LOWER EXTREMITY MMT:    MMT Right eval Left eval Right/Left 03/30/22  Hip flexion 4-/5 4-/5 4-  - 4/5  Hip extension     Hip abduction 4/5 4/5 4+/5  Hip adduction     Hip internal rotation     Hip external rotation     Knee flexion 4/5 4/5 4+/5  Knee extension 4/5 4+/5 4+/5  Ankle dorsiflexion 4+/5 4+/5 5-/5  Ankle plantarflexion     Ankle inversion     Ankle eversion      (Blank rows = not tested)  Bil shoulder strength: 4/5  FUNCTIONAL TESTS:  5 times sit to stand: 14.66 with use of hands on chair  Timed up and go (TUG): 18.03 with cane  02/03/22: 5x sit to stand: 14 seconds  TUG: 17.87 seconds with walker 03/30/22: pt declines completion  GAIT: Distance walked: 50 Assistive device utilized: Single point cane Level of assistance: Modified independence Comments: slow mobility, narrow base of support with scissoring, shortened step length    TODAY'S TREATMENT  Date: 03/25/22 Pt seen for aquatic therapy today.  Treatment took place in water 3.25-4.8 ft in depth at the [facility] pool. Temp of water was 91.  Pt entered/exited the pool via stairs step to pattern cga with bilat rail. Used WC to and from pool  -Walking forward x 4 widths, backward x 2, side stepping x 2 supported by white barbell and sba-sup in 3.43f -Standing ue supported on wall: df; pf; marching; add/abd, squats and hip extension x15.  Pt requires buoyancy for support and to offload joints with strengthening exercises. Viscosity of the water is needed for resistance of strengthening; water current perturbations provides challenge to standing balance unsupported, requiring increased core activation.  Objective testing   PATIENT EDUCATION:  Education details: pool info, Code: Health and safety inspector Person educated: Patient Education method: Explanation, Media planner, and Handouts Education comprehension: verbalized understanding and returned demonstration   HOME EXERCISE PROGRAM: Access Code: [email or web address] URL: [email or web address] Date: 02/03/2022 Prepared by: Claiborne Billings  Exercises - Sit to Stand  - 2 x daily - 7 x weekly - 2 sets - 5 reps - Seated Hamstring Stretch  - 2 x daily - 7 x weekly - 1 sets - 3 reps - 20 hold - Supine Shoulder Flexion AAROM with Hands Clasped  - 2 x daily - 7 x weekly - 1 sets - 10 reps - 5 hold - Seated Shoulder Flexion  - 2 x daily - 7 x weekly - 1 sets - 10 reps - Seated Isometric Hip Adduction with Ball  - 2 x daily - 7 x weekly - 1 sets - 10 reps - 5 hold - Seated Heel Toe Raises  - 3 x daily - 7 x weekly - 2 sets - 10 reps  ASSESSMENT:  CLINICAL IMPRESSION:   Pt reports she does not feel as though the aquatic therapy is helping her.  She has made small gains in strength but reports increased in discomfort after many of the session.  She declined functional testing today as she was concerned it may increase knee pain which has just "settled down" since last session. She has met 2/4 STG with improvement with shoulder pain able to reach beyond 90% with increased ease but continues to require mod UE assist with STS. Pt has copy of her HEP, reports some compliance.  She will be DC today rather than re-certified.     OBJECTIVE IMPAIRMENTS Abnormal gait, decreased balance, decreased endurance, decreased mobility, difficulty walking, decreased ROM, impaired flexibility, impaired UE functional  use, improper body mechanics, postural dysfunction, and pain.   ACTIVITY LIMITATIONS bending, standing, squatting, stairs, transfers, reach over head, and locomotion level  PARTICIPATION LIMITATIONS: meal prep, cleaning, shopping, and community activity  PERSONAL FACTORS Age, Time since onset of injury/illness/exacerbation, and 1-2 comorbidities: spinal stenosis, chronic LBP, knee OA  are also affecting patient's functional outcome.   REHAB POTENTIAL: Good  CLINICAL DECISION MAKING: Evolving/moderate complexity  EVALUATION COMPLEXITY: Moderate   GOALS: Goals reviewed with patient? Yes  SHORT TERM GOALS: Target date: 03/02/22  Be independent in initial HEP Baseline: Goal status: Achieved   2.  Report 25% increased ease with reaching overhead with functional tasks  Baseline: limited Shoulder A/ROM and pain with reaching past 90 Goal status:Achieved  3.  Improve LE strength to stand from chair with min UE support  Baseline: mod UE support (02/03/22) Goal status: revised   4.  Perform TUG in < or = to 15 seconds to improve balance  Baseline:  Goal status: Not met     LONG TERM GOALS: Target date: 03/31/22  Be independent in advanced HEP Baseline:  Goal status: Not met  2.  Perform sit to stand with min UE support due to improved LE strength  Baseline: moderate UE support (02/03/22) Goal status: Not met  3.  Verbalize and demonstrate body mechanics modifications for lumbar and knee protection with functional tasks  Baseline:  Goal status: Not met  4.  Perform TUG in < or = to 13 seconds to  improve balance  Baseline:  Goal status: In progress   5.  Report 50% increased ease with reaching overhead with daily tasks  Baseline: 10% increased ease (02/03/22) Goal status:Not met   6.  Report a 25% reduction in knee and lumbar pain to improve function Baseline: no change (02/03/22)   Goal Status:Not met      PLAN: PT FREQUENCY: 1-2x/wk  PT DURATION: 8 weeks  PLANNED  INTERVENTIONS: Therapeutic exercises, Therapeutic activity, Neuromuscular re-education, Balance training, Gait training, Patient/Family education, Joint mobilization, Stair training, Aquatic Therapy, Dry Needling, Electrical stimulation, Spinal manipulation, Spinal mobilization, Cryotherapy, Moist heat, Taping, and Manual therapy.  PLAN FOR NEXT SESSION: aquatic PT to strengthen muscles and improve mobility, reduce pain    Stanton Kidney (Frankie) Anni Hocevar MPT 03/30/22 11:24 AM

## 2022-03-30 ENCOUNTER — Encounter (HOSPITAL_BASED_OUTPATIENT_CLINIC_OR_DEPARTMENT_OTHER): Payer: Self-pay | Admitting: Physical Therapy

## 2022-03-30 ENCOUNTER — Ambulatory Visit (HOSPITAL_BASED_OUTPATIENT_CLINIC_OR_DEPARTMENT_OTHER): Payer: Medicare HMO | Attending: Physical Medicine and Rehabilitation | Admitting: Physical Therapy

## 2022-03-30 DIAGNOSIS — M5459 Other low back pain: Secondary | ICD-10-CM | POA: Diagnosis not present

## 2022-03-30 DIAGNOSIS — M25562 Pain in left knee: Secondary | ICD-10-CM | POA: Insufficient documentation

## 2022-03-30 DIAGNOSIS — G8929 Other chronic pain: Secondary | ICD-10-CM | POA: Diagnosis not present

## 2022-03-30 DIAGNOSIS — M25561 Pain in right knee: Secondary | ICD-10-CM | POA: Insufficient documentation

## 2022-03-30 DIAGNOSIS — R2689 Other abnormalities of gait and mobility: Secondary | ICD-10-CM | POA: Insufficient documentation

## 2022-04-01 ENCOUNTER — Ambulatory Visit: Payer: Medicare HMO

## 2022-04-15 DIAGNOSIS — B354 Tinea corporis: Secondary | ICD-10-CM | POA: Diagnosis not present

## 2022-04-15 DIAGNOSIS — L738 Other specified follicular disorders: Secondary | ICD-10-CM | POA: Diagnosis not present

## 2022-04-15 DIAGNOSIS — L820 Inflamed seborrheic keratosis: Secondary | ICD-10-CM | POA: Diagnosis not present

## 2022-04-15 DIAGNOSIS — L570 Actinic keratosis: Secondary | ICD-10-CM | POA: Diagnosis not present

## 2022-04-20 DIAGNOSIS — M47816 Spondylosis without myelopathy or radiculopathy, lumbar region: Secondary | ICD-10-CM | POA: Diagnosis not present

## 2022-04-20 DIAGNOSIS — G894 Chronic pain syndrome: Secondary | ICD-10-CM | POA: Diagnosis not present

## 2022-04-20 DIAGNOSIS — M48061 Spinal stenosis, lumbar region without neurogenic claudication: Secondary | ICD-10-CM | POA: Diagnosis not present

## 2022-04-20 DIAGNOSIS — M170 Bilateral primary osteoarthritis of knee: Secondary | ICD-10-CM | POA: Diagnosis not present

## 2022-04-22 ENCOUNTER — Ambulatory Visit (HOSPITAL_BASED_OUTPATIENT_CLINIC_OR_DEPARTMENT_OTHER): Payer: Medicare HMO | Admitting: Physical Therapy

## 2022-04-29 ENCOUNTER — Ambulatory Visit (HOSPITAL_BASED_OUTPATIENT_CLINIC_OR_DEPARTMENT_OTHER): Payer: Medicare HMO | Admitting: Physical Therapy

## 2022-05-04 DIAGNOSIS — H04123 Dry eye syndrome of bilateral lacrimal glands: Secondary | ICD-10-CM | POA: Diagnosis not present

## 2022-05-04 DIAGNOSIS — H26492 Other secondary cataract, left eye: Secondary | ICD-10-CM | POA: Diagnosis not present

## 2022-05-04 DIAGNOSIS — Z961 Presence of intraocular lens: Secondary | ICD-10-CM | POA: Diagnosis not present

## 2022-05-04 DIAGNOSIS — H40013 Open angle with borderline findings, low risk, bilateral: Secondary | ICD-10-CM | POA: Diagnosis not present

## 2022-05-04 DIAGNOSIS — H524 Presbyopia: Secondary | ICD-10-CM | POA: Diagnosis not present

## 2022-05-06 ENCOUNTER — Ambulatory Visit (HOSPITAL_BASED_OUTPATIENT_CLINIC_OR_DEPARTMENT_OTHER): Payer: Medicare HMO | Admitting: Physical Therapy

## 2022-05-13 ENCOUNTER — Ambulatory Visit (HOSPITAL_BASED_OUTPATIENT_CLINIC_OR_DEPARTMENT_OTHER): Payer: Medicare HMO | Admitting: Physical Therapy

## 2022-05-18 DIAGNOSIS — M170 Bilateral primary osteoarthritis of knee: Secondary | ICD-10-CM | POA: Diagnosis not present

## 2022-05-18 DIAGNOSIS — G894 Chronic pain syndrome: Secondary | ICD-10-CM | POA: Diagnosis not present

## 2022-05-18 DIAGNOSIS — Z79891 Long term (current) use of opiate analgesic: Secondary | ICD-10-CM | POA: Diagnosis not present

## 2022-05-18 DIAGNOSIS — M48061 Spinal stenosis, lumbar region without neurogenic claudication: Secondary | ICD-10-CM | POA: Diagnosis not present

## 2022-05-18 DIAGNOSIS — M47816 Spondylosis without myelopathy or radiculopathy, lumbar region: Secondary | ICD-10-CM | POA: Diagnosis not present

## 2022-06-16 DIAGNOSIS — G894 Chronic pain syndrome: Secondary | ICD-10-CM | POA: Diagnosis not present

## 2022-06-16 DIAGNOSIS — M47816 Spondylosis without myelopathy or radiculopathy, lumbar region: Secondary | ICD-10-CM | POA: Diagnosis not present

## 2022-06-16 DIAGNOSIS — M48061 Spinal stenosis, lumbar region without neurogenic claudication: Secondary | ICD-10-CM | POA: Diagnosis not present

## 2022-06-16 DIAGNOSIS — M170 Bilateral primary osteoarthritis of knee: Secondary | ICD-10-CM | POA: Diagnosis not present

## 2022-07-15 DIAGNOSIS — M47816 Spondylosis without myelopathy or radiculopathy, lumbar region: Secondary | ICD-10-CM | POA: Diagnosis not present

## 2022-07-15 DIAGNOSIS — G894 Chronic pain syndrome: Secondary | ICD-10-CM | POA: Diagnosis not present

## 2022-07-15 DIAGNOSIS — M48061 Spinal stenosis, lumbar region without neurogenic claudication: Secondary | ICD-10-CM | POA: Diagnosis not present

## 2022-07-15 DIAGNOSIS — M170 Bilateral primary osteoarthritis of knee: Secondary | ICD-10-CM | POA: Diagnosis not present

## 2022-08-12 DIAGNOSIS — M170 Bilateral primary osteoarthritis of knee: Secondary | ICD-10-CM | POA: Diagnosis not present

## 2022-08-12 DIAGNOSIS — G894 Chronic pain syndrome: Secondary | ICD-10-CM | POA: Diagnosis not present

## 2022-08-12 DIAGNOSIS — M48061 Spinal stenosis, lumbar region without neurogenic claudication: Secondary | ICD-10-CM | POA: Diagnosis not present

## 2022-08-12 DIAGNOSIS — M47816 Spondylosis without myelopathy or radiculopathy, lumbar region: Secondary | ICD-10-CM | POA: Diagnosis not present

## 2022-08-12 DIAGNOSIS — Z79891 Long term (current) use of opiate analgesic: Secondary | ICD-10-CM | POA: Diagnosis not present

## 2022-08-27 DIAGNOSIS — I10 Essential (primary) hypertension: Secondary | ICD-10-CM | POA: Diagnosis not present

## 2022-08-27 DIAGNOSIS — E782 Mixed hyperlipidemia: Secondary | ICD-10-CM | POA: Diagnosis not present

## 2022-08-27 DIAGNOSIS — E039 Hypothyroidism, unspecified: Secondary | ICD-10-CM | POA: Diagnosis not present

## 2022-08-27 DIAGNOSIS — M1990 Unspecified osteoarthritis, unspecified site: Secondary | ICD-10-CM | POA: Diagnosis not present

## 2022-09-09 DIAGNOSIS — M170 Bilateral primary osteoarthritis of knee: Secondary | ICD-10-CM | POA: Diagnosis not present

## 2022-09-09 DIAGNOSIS — M48061 Spinal stenosis, lumbar region without neurogenic claudication: Secondary | ICD-10-CM | POA: Diagnosis not present

## 2022-09-09 DIAGNOSIS — M47816 Spondylosis without myelopathy or radiculopathy, lumbar region: Secondary | ICD-10-CM | POA: Diagnosis not present

## 2022-09-09 DIAGNOSIS — G894 Chronic pain syndrome: Secondary | ICD-10-CM | POA: Diagnosis not present

## 2022-10-07 DIAGNOSIS — G894 Chronic pain syndrome: Secondary | ICD-10-CM | POA: Diagnosis not present

## 2022-10-07 DIAGNOSIS — M170 Bilateral primary osteoarthritis of knee: Secondary | ICD-10-CM | POA: Diagnosis not present

## 2022-10-07 DIAGNOSIS — M47816 Spondylosis without myelopathy or radiculopathy, lumbar region: Secondary | ICD-10-CM | POA: Diagnosis not present

## 2022-11-04 DIAGNOSIS — M48061 Spinal stenosis, lumbar region without neurogenic claudication: Secondary | ICD-10-CM | POA: Diagnosis not present

## 2022-11-04 DIAGNOSIS — G894 Chronic pain syndrome: Secondary | ICD-10-CM | POA: Diagnosis not present

## 2022-11-04 DIAGNOSIS — M47816 Spondylosis without myelopathy or radiculopathy, lumbar region: Secondary | ICD-10-CM | POA: Diagnosis not present

## 2022-11-04 DIAGNOSIS — M170 Bilateral primary osteoarthritis of knee: Secondary | ICD-10-CM | POA: Diagnosis not present

## 2022-11-18 DIAGNOSIS — M7552 Bursitis of left shoulder: Secondary | ICD-10-CM | POA: Diagnosis not present

## 2022-12-03 DIAGNOSIS — M1711 Unilateral primary osteoarthritis, right knee: Secondary | ICD-10-CM | POA: Diagnosis not present

## 2022-12-08 DIAGNOSIS — G894 Chronic pain syndrome: Secondary | ICD-10-CM | POA: Diagnosis not present

## 2022-12-08 DIAGNOSIS — M170 Bilateral primary osteoarthritis of knee: Secondary | ICD-10-CM | POA: Diagnosis not present

## 2022-12-08 DIAGNOSIS — M47816 Spondylosis without myelopathy or radiculopathy, lumbar region: Secondary | ICD-10-CM | POA: Diagnosis not present

## 2022-12-08 DIAGNOSIS — M48061 Spinal stenosis, lumbar region without neurogenic claudication: Secondary | ICD-10-CM | POA: Diagnosis not present

## 2022-12-17 DIAGNOSIS — M1711 Unilateral primary osteoarthritis, right knee: Secondary | ICD-10-CM | POA: Diagnosis not present

## 2022-12-24 DIAGNOSIS — M1711 Unilateral primary osteoarthritis, right knee: Secondary | ICD-10-CM | POA: Diagnosis not present

## 2023-01-05 DIAGNOSIS — F419 Anxiety disorder, unspecified: Secondary | ICD-10-CM | POA: Diagnosis not present

## 2023-01-05 DIAGNOSIS — M810 Age-related osteoporosis without current pathological fracture: Secondary | ICD-10-CM | POA: Diagnosis not present

## 2023-01-05 DIAGNOSIS — I10 Essential (primary) hypertension: Secondary | ICD-10-CM | POA: Diagnosis not present

## 2023-01-05 DIAGNOSIS — M545 Low back pain, unspecified: Secondary | ICD-10-CM | POA: Diagnosis not present

## 2023-01-05 DIAGNOSIS — K5900 Constipation, unspecified: Secondary | ICD-10-CM | POA: Diagnosis not present

## 2023-01-05 DIAGNOSIS — R32 Unspecified urinary incontinence: Secondary | ICD-10-CM | POA: Diagnosis not present

## 2023-01-05 DIAGNOSIS — M1990 Unspecified osteoarthritis, unspecified site: Secondary | ICD-10-CM | POA: Diagnosis not present

## 2023-01-05 DIAGNOSIS — Z79891 Long term (current) use of opiate analgesic: Secondary | ICD-10-CM | POA: Diagnosis not present

## 2023-01-05 DIAGNOSIS — I499 Cardiac arrhythmia, unspecified: Secondary | ICD-10-CM | POA: Diagnosis not present

## 2023-01-05 DIAGNOSIS — Z008 Encounter for other general examination: Secondary | ICD-10-CM | POA: Diagnosis not present

## 2023-01-05 DIAGNOSIS — E039 Hypothyroidism, unspecified: Secondary | ICD-10-CM | POA: Diagnosis not present

## 2023-01-22 DIAGNOSIS — G894 Chronic pain syndrome: Secondary | ICD-10-CM | POA: Diagnosis not present

## 2023-01-22 DIAGNOSIS — M170 Bilateral primary osteoarthritis of knee: Secondary | ICD-10-CM | POA: Diagnosis not present

## 2023-01-22 DIAGNOSIS — M48061 Spinal stenosis, lumbar region without neurogenic claudication: Secondary | ICD-10-CM | POA: Diagnosis not present

## 2023-01-22 DIAGNOSIS — M47816 Spondylosis without myelopathy or radiculopathy, lumbar region: Secondary | ICD-10-CM | POA: Diagnosis not present

## 2023-02-18 DIAGNOSIS — M170 Bilateral primary osteoarthritis of knee: Secondary | ICD-10-CM | POA: Diagnosis not present

## 2023-02-18 DIAGNOSIS — M48061 Spinal stenosis, lumbar region without neurogenic claudication: Secondary | ICD-10-CM | POA: Diagnosis not present

## 2023-02-18 DIAGNOSIS — G894 Chronic pain syndrome: Secondary | ICD-10-CM | POA: Diagnosis not present

## 2023-02-18 DIAGNOSIS — M47816 Spondylosis without myelopathy or radiculopathy, lumbar region: Secondary | ICD-10-CM | POA: Diagnosis not present

## 2023-03-03 DIAGNOSIS — Z961 Presence of intraocular lens: Secondary | ICD-10-CM | POA: Diagnosis not present

## 2023-03-03 DIAGNOSIS — H16223 Keratoconjunctivitis sicca, not specified as Sjogren's, bilateral: Secondary | ICD-10-CM | POA: Diagnosis not present

## 2023-03-03 DIAGNOSIS — H43812 Vitreous degeneration, left eye: Secondary | ICD-10-CM | POA: Diagnosis not present

## 2023-03-10 DIAGNOSIS — Z1331 Encounter for screening for depression: Secondary | ICD-10-CM | POA: Diagnosis not present

## 2023-03-10 DIAGNOSIS — E039 Hypothyroidism, unspecified: Secondary | ICD-10-CM | POA: Diagnosis not present

## 2023-03-10 DIAGNOSIS — G8929 Other chronic pain: Secondary | ICD-10-CM | POA: Diagnosis not present

## 2023-03-10 DIAGNOSIS — R54 Age-related physical debility: Secondary | ICD-10-CM | POA: Diagnosis not present

## 2023-03-10 DIAGNOSIS — M1990 Unspecified osteoarthritis, unspecified site: Secondary | ICD-10-CM | POA: Diagnosis not present

## 2023-03-10 DIAGNOSIS — I10 Essential (primary) hypertension: Secondary | ICD-10-CM | POA: Diagnosis not present

## 2023-03-10 DIAGNOSIS — E782 Mixed hyperlipidemia: Secondary | ICD-10-CM | POA: Diagnosis not present

## 2023-03-10 DIAGNOSIS — Z23 Encounter for immunization: Secondary | ICD-10-CM | POA: Diagnosis not present

## 2023-03-10 DIAGNOSIS — F419 Anxiety disorder, unspecified: Secondary | ICD-10-CM | POA: Diagnosis not present

## 2023-03-10 DIAGNOSIS — Z0000 Encounter for general adult medical examination without abnormal findings: Secondary | ICD-10-CM | POA: Diagnosis not present

## 2023-03-10 DIAGNOSIS — M48061 Spinal stenosis, lumbar region without neurogenic claudication: Secondary | ICD-10-CM | POA: Diagnosis not present

## 2023-03-11 DIAGNOSIS — L97811 Non-pressure chronic ulcer of other part of right lower leg limited to breakdown of skin: Secondary | ICD-10-CM | POA: Diagnosis not present

## 2023-03-11 DIAGNOSIS — L570 Actinic keratosis: Secondary | ICD-10-CM | POA: Diagnosis not present

## 2023-03-11 DIAGNOSIS — L565 Disseminated superficial actinic porokeratosis (DSAP): Secondary | ICD-10-CM | POA: Diagnosis not present

## 2023-03-16 DIAGNOSIS — M1990 Unspecified osteoarthritis, unspecified site: Secondary | ICD-10-CM | POA: Diagnosis not present

## 2023-03-16 DIAGNOSIS — F419 Anxiety disorder, unspecified: Secondary | ICD-10-CM | POA: Diagnosis not present

## 2023-03-16 DIAGNOSIS — E039 Hypothyroidism, unspecified: Secondary | ICD-10-CM | POA: Diagnosis not present

## 2023-03-16 DIAGNOSIS — E782 Mixed hyperlipidemia: Secondary | ICD-10-CM | POA: Diagnosis not present

## 2023-03-16 DIAGNOSIS — M48061 Spinal stenosis, lumbar region without neurogenic claudication: Secondary | ICD-10-CM | POA: Diagnosis not present

## 2023-03-16 DIAGNOSIS — G8929 Other chronic pain: Secondary | ICD-10-CM | POA: Diagnosis not present

## 2023-03-16 DIAGNOSIS — R54 Age-related physical debility: Secondary | ICD-10-CM | POA: Diagnosis not present

## 2023-03-16 DIAGNOSIS — I10 Essential (primary) hypertension: Secondary | ICD-10-CM | POA: Diagnosis not present

## 2023-03-16 DIAGNOSIS — G629 Polyneuropathy, unspecified: Secondary | ICD-10-CM | POA: Diagnosis not present

## 2023-03-22 ENCOUNTER — Ambulatory Visit: Payer: Medicare HMO | Admitting: Podiatry

## 2023-03-29 ENCOUNTER — Encounter: Payer: Self-pay | Admitting: Podiatry

## 2023-03-29 ENCOUNTER — Ambulatory Visit: Payer: Medicare HMO | Admitting: Podiatry

## 2023-03-29 DIAGNOSIS — M79675 Pain in left toe(s): Secondary | ICD-10-CM

## 2023-03-29 DIAGNOSIS — L600 Ingrowing nail: Secondary | ICD-10-CM

## 2023-03-29 DIAGNOSIS — B351 Tinea unguium: Secondary | ICD-10-CM | POA: Diagnosis not present

## 2023-03-29 DIAGNOSIS — M79674 Pain in right toe(s): Secondary | ICD-10-CM | POA: Diagnosis not present

## 2023-03-29 NOTE — Progress Notes (Signed)
       Subjective:  Patient ID: Shelia Jimenez, female    DOB: Jan 06, 1933,  MRN: [id number]   Shelia Jimenez presents to clinic today for:  Chief Complaint  Patient presents with   Debridement    Requesting toenail trim   New Patient (Initial Visit)  . Patient notes nails are thick, discolored, elongated and painful in shoegear when trying to ambulate.  Patient notes her right hallux lateral nail border feels a little ingrown and would like the distal edge cut back today.  Denies drainage  PCP is Cox, Kirsten, MD.  Past Medical History:  Diagnosis Date   Hypercholesterolemia    Hypertension    Hypothyroidism     Past Surgical History:  Procedure Laterality Date   CATARACT EXTRACTION     MENISECTOMY Right 2015   TONSILLECTOMY AND ADENOIDECTOMY      Allergies  Allergen Reactions   Ciprofloxacin Other (See Comments)   Escitalopram Oxalate Other (See Comments)   Metoprolol Tartrate Other (See Comments)   Ace Inhibitors    Amoxicillin    Celebrex [Celecoxib]    Crestor [Rosuvastatin]    Iodine    Statins     Review of Systems: Negative except as noted in the HPI.  Objective:  There were no vitals filed for this visit.  NICKOLE ADAMEK is a pleasant 89 y.o. female in NAD. AAO x 3.  Vascular Examination: Capillary refill time is 3-5 seconds to toes bilateral. Palpable pedal pulses b/l LE. Digital hair present b/l.  Skin temperature gradient WNL b/l. No varicosities b/l. No cyanosis noted b/l.   Dermatological Examination: Pedal skin with normal turgor, texture and tone b/l. No open wounds. No interdigital macerations b/l. Toenails x10 are 3mm thick, discolored, dystrophic with subungual debris. There is pain with compression of the nail plates.  They are elongated x10.  The right hallux lateral nail border is incurvated distally but there are no signs of paronychia.  There is no drainage.  Assessment/Plan: 1. Pain due to onychomycosis of toenails of both feet   2.  Ingrown toenail    The mycotic toenails were sharply debrided x10 with sterile nail nippers and a power debriding burr to decrease bulk/thickness and length.  The right hallux distal-lateral nail border was cut back uneventfully.  Immediate relief was noted.  Return in about 3 months (around 06/28/2023) for RFC.   Clerance Lav, DPM, FACFAS [facility]     [address] [place], [place] [address]                Office [phone]  Fax [phone]

## 2023-04-07 DIAGNOSIS — R54 Age-related physical debility: Secondary | ICD-10-CM | POA: Diagnosis not present

## 2023-04-09 DIAGNOSIS — W19XXXA Unspecified fall, initial encounter: Secondary | ICD-10-CM | POA: Diagnosis not present

## 2023-04-09 DIAGNOSIS — S79912A Unspecified injury of left hip, initial encounter: Secondary | ICD-10-CM | POA: Diagnosis not present

## 2023-04-15 DIAGNOSIS — M48061 Spinal stenosis, lumbar region without neurogenic claudication: Secondary | ICD-10-CM | POA: Diagnosis not present

## 2023-04-15 DIAGNOSIS — M170 Bilateral primary osteoarthritis of knee: Secondary | ICD-10-CM | POA: Diagnosis not present

## 2023-04-15 DIAGNOSIS — G894 Chronic pain syndrome: Secondary | ICD-10-CM | POA: Diagnosis not present

## 2023-04-15 DIAGNOSIS — M47816 Spondylosis without myelopathy or radiculopathy, lumbar region: Secondary | ICD-10-CM | POA: Diagnosis not present

## 2023-05-13 DIAGNOSIS — Z79891 Long term (current) use of opiate analgesic: Secondary | ICD-10-CM | POA: Diagnosis not present

## 2023-05-13 DIAGNOSIS — G894 Chronic pain syndrome: Secondary | ICD-10-CM | POA: Diagnosis not present

## 2023-05-13 DIAGNOSIS — M47816 Spondylosis without myelopathy or radiculopathy, lumbar region: Secondary | ICD-10-CM | POA: Diagnosis not present

## 2023-05-13 DIAGNOSIS — M170 Bilateral primary osteoarthritis of knee: Secondary | ICD-10-CM | POA: Diagnosis not present

## 2023-05-13 DIAGNOSIS — M48061 Spinal stenosis, lumbar region without neurogenic claudication: Secondary | ICD-10-CM | POA: Diagnosis not present

## 2023-05-18 DIAGNOSIS — M7552 Bursitis of left shoulder: Secondary | ICD-10-CM | POA: Diagnosis not present

## 2023-05-18 DIAGNOSIS — M170 Bilateral primary osteoarthritis of knee: Secondary | ICD-10-CM | POA: Diagnosis not present

## 2023-05-18 DIAGNOSIS — G894 Chronic pain syndrome: Secondary | ICD-10-CM | POA: Diagnosis not present

## 2023-05-21 DIAGNOSIS — R609 Edema, unspecified: Secondary | ICD-10-CM | POA: Diagnosis not present

## 2023-06-07 DIAGNOSIS — M170 Bilateral primary osteoarthritis of knee: Secondary | ICD-10-CM | POA: Diagnosis not present

## 2023-06-07 DIAGNOSIS — G894 Chronic pain syndrome: Secondary | ICD-10-CM | POA: Diagnosis not present

## 2023-06-07 DIAGNOSIS — M7552 Bursitis of left shoulder: Secondary | ICD-10-CM | POA: Diagnosis not present

## 2023-06-10 DIAGNOSIS — M47816 Spondylosis without myelopathy or radiculopathy, lumbar region: Secondary | ICD-10-CM | POA: Diagnosis not present

## 2023-06-10 DIAGNOSIS — M48061 Spinal stenosis, lumbar region without neurogenic claudication: Secondary | ICD-10-CM | POA: Diagnosis not present

## 2023-06-10 DIAGNOSIS — M170 Bilateral primary osteoarthritis of knee: Secondary | ICD-10-CM | POA: Diagnosis not present

## 2023-06-10 DIAGNOSIS — G894 Chronic pain syndrome: Secondary | ICD-10-CM | POA: Diagnosis not present

## 2023-06-28 ENCOUNTER — Ambulatory Visit: Payer: Medicare HMO | Admitting: Podiatry

## 2023-07-08 DIAGNOSIS — M7552 Bursitis of left shoulder: Secondary | ICD-10-CM | POA: Diagnosis not present

## 2023-07-08 DIAGNOSIS — M170 Bilateral primary osteoarthritis of knee: Secondary | ICD-10-CM | POA: Diagnosis not present

## 2023-07-08 DIAGNOSIS — G894 Chronic pain syndrome: Secondary | ICD-10-CM | POA: Diagnosis not present

## 2023-07-20 DIAGNOSIS — M48061 Spinal stenosis, lumbar region without neurogenic claudication: Secondary | ICD-10-CM | POA: Diagnosis not present

## 2023-07-20 DIAGNOSIS — G894 Chronic pain syndrome: Secondary | ICD-10-CM | POA: Diagnosis not present

## 2023-07-20 DIAGNOSIS — M170 Bilateral primary osteoarthritis of knee: Secondary | ICD-10-CM | POA: Diagnosis not present

## 2023-07-20 DIAGNOSIS — M47816 Spondylosis without myelopathy or radiculopathy, lumbar region: Secondary | ICD-10-CM | POA: Diagnosis not present

## 2023-07-29 DIAGNOSIS — L565 Disseminated superficial actinic porokeratosis (DSAP): Secondary | ICD-10-CM | POA: Diagnosis not present

## 2023-07-29 DIAGNOSIS — L97811 Non-pressure chronic ulcer of other part of right lower leg limited to breakdown of skin: Secondary | ICD-10-CM | POA: Diagnosis not present

## 2023-08-08 DIAGNOSIS — M7552 Bursitis of left shoulder: Secondary | ICD-10-CM | POA: Diagnosis not present

## 2023-08-08 DIAGNOSIS — M170 Bilateral primary osteoarthritis of knee: Secondary | ICD-10-CM | POA: Diagnosis not present

## 2023-08-08 DIAGNOSIS — G894 Chronic pain syndrome: Secondary | ICD-10-CM | POA: Diagnosis not present

## 2023-09-07 DIAGNOSIS — M7552 Bursitis of left shoulder: Secondary | ICD-10-CM | POA: Diagnosis not present

## 2023-09-07 DIAGNOSIS — G894 Chronic pain syndrome: Secondary | ICD-10-CM | POA: Diagnosis not present

## 2023-09-07 DIAGNOSIS — M170 Bilateral primary osteoarthritis of knee: Secondary | ICD-10-CM | POA: Diagnosis not present

## 2023-09-09 DIAGNOSIS — I10 Essential (primary) hypertension: Secondary | ICD-10-CM | POA: Diagnosis not present

## 2023-09-09 DIAGNOSIS — E039 Hypothyroidism, unspecified: Secondary | ICD-10-CM | POA: Diagnosis not present

## 2023-09-15 DIAGNOSIS — M47816 Spondylosis without myelopathy or radiculopathy, lumbar region: Secondary | ICD-10-CM | POA: Diagnosis not present

## 2023-09-15 DIAGNOSIS — M170 Bilateral primary osteoarthritis of knee: Secondary | ICD-10-CM | POA: Diagnosis not present

## 2023-09-15 DIAGNOSIS — M48061 Spinal stenosis, lumbar region without neurogenic claudication: Secondary | ICD-10-CM | POA: Diagnosis not present

## 2023-09-15 DIAGNOSIS — G894 Chronic pain syndrome: Secondary | ICD-10-CM | POA: Diagnosis not present

## 2023-09-27 DIAGNOSIS — I10 Essential (primary) hypertension: Secondary | ICD-10-CM | POA: Diagnosis not present

## 2023-09-30 DIAGNOSIS — R234 Changes in skin texture: Secondary | ICD-10-CM | POA: Diagnosis not present

## 2023-09-30 DIAGNOSIS — Z604 Social exclusion and rejection: Secondary | ICD-10-CM | POA: Diagnosis not present

## 2023-09-30 DIAGNOSIS — M1612 Unilateral primary osteoarthritis, left hip: Secondary | ICD-10-CM | POA: Diagnosis not present

## 2023-09-30 DIAGNOSIS — Z7982 Long term (current) use of aspirin: Secondary | ICD-10-CM | POA: Diagnosis not present

## 2023-09-30 DIAGNOSIS — I10 Essential (primary) hypertension: Secondary | ICD-10-CM | POA: Diagnosis not present

## 2023-09-30 DIAGNOSIS — Z602 Problems related to living alone: Secondary | ICD-10-CM | POA: Diagnosis not present

## 2023-09-30 DIAGNOSIS — Z9181 History of falling: Secondary | ICD-10-CM | POA: Diagnosis not present

## 2023-09-30 DIAGNOSIS — E039 Hypothyroidism, unspecified: Secondary | ICD-10-CM | POA: Diagnosis not present

## 2023-09-30 DIAGNOSIS — S81801D Unspecified open wound, right lower leg, subsequent encounter: Secondary | ICD-10-CM | POA: Diagnosis not present

## 2023-09-30 DIAGNOSIS — G894 Chronic pain syndrome: Secondary | ICD-10-CM | POA: Diagnosis not present

## 2023-09-30 DIAGNOSIS — M25512 Pain in left shoulder: Secondary | ICD-10-CM | POA: Diagnosis not present

## 2023-09-30 DIAGNOSIS — H3530 Unspecified macular degeneration: Secondary | ICD-10-CM | POA: Diagnosis not present

## 2023-09-30 DIAGNOSIS — I2510 Atherosclerotic heart disease of native coronary artery without angina pectoris: Secondary | ICD-10-CM | POA: Diagnosis not present

## 2023-10-05 DIAGNOSIS — R234 Changes in skin texture: Secondary | ICD-10-CM | POA: Diagnosis not present

## 2023-10-05 DIAGNOSIS — Z602 Problems related to living alone: Secondary | ICD-10-CM | POA: Diagnosis not present

## 2023-10-05 DIAGNOSIS — Z9181 History of falling: Secondary | ICD-10-CM | POA: Diagnosis not present

## 2023-10-05 DIAGNOSIS — H3530 Unspecified macular degeneration: Secondary | ICD-10-CM | POA: Diagnosis not present

## 2023-10-05 DIAGNOSIS — Z604 Social exclusion and rejection: Secondary | ICD-10-CM | POA: Diagnosis not present

## 2023-10-05 DIAGNOSIS — E039 Hypothyroidism, unspecified: Secondary | ICD-10-CM | POA: Diagnosis not present

## 2023-10-05 DIAGNOSIS — M25512 Pain in left shoulder: Secondary | ICD-10-CM | POA: Diagnosis not present

## 2023-10-05 DIAGNOSIS — M1612 Unilateral primary osteoarthritis, left hip: Secondary | ICD-10-CM | POA: Diagnosis not present

## 2023-10-05 DIAGNOSIS — I10 Essential (primary) hypertension: Secondary | ICD-10-CM | POA: Diagnosis not present

## 2023-10-05 DIAGNOSIS — G894 Chronic pain syndrome: Secondary | ICD-10-CM | POA: Diagnosis not present

## 2023-10-05 DIAGNOSIS — Z7982 Long term (current) use of aspirin: Secondary | ICD-10-CM | POA: Diagnosis not present

## 2023-10-05 DIAGNOSIS — I2510 Atherosclerotic heart disease of native coronary artery without angina pectoris: Secondary | ICD-10-CM | POA: Diagnosis not present

## 2023-10-05 DIAGNOSIS — S81801D Unspecified open wound, right lower leg, subsequent encounter: Secondary | ICD-10-CM | POA: Diagnosis not present

## 2023-10-08 DIAGNOSIS — M170 Bilateral primary osteoarthritis of knee: Secondary | ICD-10-CM | POA: Diagnosis not present

## 2023-10-08 DIAGNOSIS — G894 Chronic pain syndrome: Secondary | ICD-10-CM | POA: Diagnosis not present

## 2023-10-08 DIAGNOSIS — M7552 Bursitis of left shoulder: Secondary | ICD-10-CM | POA: Diagnosis not present

## 2023-10-09 DIAGNOSIS — I2510 Atherosclerotic heart disease of native coronary artery without angina pectoris: Secondary | ICD-10-CM | POA: Diagnosis not present

## 2023-10-09 DIAGNOSIS — R234 Changes in skin texture: Secondary | ICD-10-CM | POA: Diagnosis not present

## 2023-10-09 DIAGNOSIS — S81801D Unspecified open wound, right lower leg, subsequent encounter: Secondary | ICD-10-CM | POA: Diagnosis not present

## 2023-10-09 DIAGNOSIS — Z604 Social exclusion and rejection: Secondary | ICD-10-CM | POA: Diagnosis not present

## 2023-10-09 DIAGNOSIS — M1612 Unilateral primary osteoarthritis, left hip: Secondary | ICD-10-CM | POA: Diagnosis not present

## 2023-10-09 DIAGNOSIS — I10 Essential (primary) hypertension: Secondary | ICD-10-CM | POA: Diagnosis not present

## 2023-10-09 DIAGNOSIS — Z602 Problems related to living alone: Secondary | ICD-10-CM | POA: Diagnosis not present

## 2023-10-09 DIAGNOSIS — M25512 Pain in left shoulder: Secondary | ICD-10-CM | POA: Diagnosis not present

## 2023-10-09 DIAGNOSIS — Z7982 Long term (current) use of aspirin: Secondary | ICD-10-CM | POA: Diagnosis not present

## 2023-10-09 DIAGNOSIS — E039 Hypothyroidism, unspecified: Secondary | ICD-10-CM | POA: Diagnosis not present

## 2023-10-09 DIAGNOSIS — Z9181 History of falling: Secondary | ICD-10-CM | POA: Diagnosis not present

## 2023-10-09 DIAGNOSIS — G894 Chronic pain syndrome: Secondary | ICD-10-CM | POA: Diagnosis not present

## 2023-10-09 DIAGNOSIS — H3530 Unspecified macular degeneration: Secondary | ICD-10-CM | POA: Diagnosis not present

## 2023-10-13 DIAGNOSIS — G894 Chronic pain syndrome: Secondary | ICD-10-CM | POA: Diagnosis not present

## 2023-10-13 DIAGNOSIS — Z7982 Long term (current) use of aspirin: Secondary | ICD-10-CM | POA: Diagnosis not present

## 2023-10-13 DIAGNOSIS — Z602 Problems related to living alone: Secondary | ICD-10-CM | POA: Diagnosis not present

## 2023-10-13 DIAGNOSIS — M25512 Pain in left shoulder: Secondary | ICD-10-CM | POA: Diagnosis not present

## 2023-10-13 DIAGNOSIS — S81801D Unspecified open wound, right lower leg, subsequent encounter: Secondary | ICD-10-CM | POA: Diagnosis not present

## 2023-10-13 DIAGNOSIS — M1612 Unilateral primary osteoarthritis, left hip: Secondary | ICD-10-CM | POA: Diagnosis not present

## 2023-10-13 DIAGNOSIS — Z604 Social exclusion and rejection: Secondary | ICD-10-CM | POA: Diagnosis not present

## 2023-10-13 DIAGNOSIS — H3530 Unspecified macular degeneration: Secondary | ICD-10-CM | POA: Diagnosis not present

## 2023-10-13 DIAGNOSIS — I10 Essential (primary) hypertension: Secondary | ICD-10-CM | POA: Diagnosis not present

## 2023-10-13 DIAGNOSIS — R234 Changes in skin texture: Secondary | ICD-10-CM | POA: Diagnosis not present

## 2023-10-13 DIAGNOSIS — Z9181 History of falling: Secondary | ICD-10-CM | POA: Diagnosis not present

## 2023-10-13 DIAGNOSIS — E039 Hypothyroidism, unspecified: Secondary | ICD-10-CM | POA: Diagnosis not present

## 2023-10-13 DIAGNOSIS — I2510 Atherosclerotic heart disease of native coronary artery without angina pectoris: Secondary | ICD-10-CM | POA: Diagnosis not present

## 2023-10-15 DIAGNOSIS — H3530 Unspecified macular degeneration: Secondary | ICD-10-CM | POA: Diagnosis not present

## 2023-10-15 DIAGNOSIS — R234 Changes in skin texture: Secondary | ICD-10-CM | POA: Diagnosis not present

## 2023-10-15 DIAGNOSIS — G894 Chronic pain syndrome: Secondary | ICD-10-CM | POA: Diagnosis not present

## 2023-10-15 DIAGNOSIS — M25512 Pain in left shoulder: Secondary | ICD-10-CM | POA: Diagnosis not present

## 2023-10-15 DIAGNOSIS — I2510 Atherosclerotic heart disease of native coronary artery without angina pectoris: Secondary | ICD-10-CM | POA: Diagnosis not present

## 2023-10-15 DIAGNOSIS — I10 Essential (primary) hypertension: Secondary | ICD-10-CM | POA: Diagnosis not present

## 2023-10-15 DIAGNOSIS — Z604 Social exclusion and rejection: Secondary | ICD-10-CM | POA: Diagnosis not present

## 2023-10-15 DIAGNOSIS — Z602 Problems related to living alone: Secondary | ICD-10-CM | POA: Diagnosis not present

## 2023-10-15 DIAGNOSIS — S81801D Unspecified open wound, right lower leg, subsequent encounter: Secondary | ICD-10-CM | POA: Diagnosis not present

## 2023-10-15 DIAGNOSIS — Z9181 History of falling: Secondary | ICD-10-CM | POA: Diagnosis not present

## 2023-10-15 DIAGNOSIS — E039 Hypothyroidism, unspecified: Secondary | ICD-10-CM | POA: Diagnosis not present

## 2023-10-15 DIAGNOSIS — M1612 Unilateral primary osteoarthritis, left hip: Secondary | ICD-10-CM | POA: Diagnosis not present

## 2023-10-15 DIAGNOSIS — Z7982 Long term (current) use of aspirin: Secondary | ICD-10-CM | POA: Diagnosis not present

## 2023-10-18 DIAGNOSIS — Z9181 History of falling: Secondary | ICD-10-CM | POA: Diagnosis not present

## 2023-10-18 DIAGNOSIS — Z604 Social exclusion and rejection: Secondary | ICD-10-CM | POA: Diagnosis not present

## 2023-10-18 DIAGNOSIS — I2510 Atherosclerotic heart disease of native coronary artery without angina pectoris: Secondary | ICD-10-CM | POA: Diagnosis not present

## 2023-10-18 DIAGNOSIS — H3530 Unspecified macular degeneration: Secondary | ICD-10-CM | POA: Diagnosis not present

## 2023-10-18 DIAGNOSIS — I10 Essential (primary) hypertension: Secondary | ICD-10-CM | POA: Diagnosis not present

## 2023-10-18 DIAGNOSIS — M1612 Unilateral primary osteoarthritis, left hip: Secondary | ICD-10-CM | POA: Diagnosis not present

## 2023-10-18 DIAGNOSIS — M25512 Pain in left shoulder: Secondary | ICD-10-CM | POA: Diagnosis not present

## 2023-10-18 DIAGNOSIS — E039 Hypothyroidism, unspecified: Secondary | ICD-10-CM | POA: Diagnosis not present

## 2023-10-18 DIAGNOSIS — G894 Chronic pain syndrome: Secondary | ICD-10-CM | POA: Diagnosis not present

## 2023-10-18 DIAGNOSIS — R234 Changes in skin texture: Secondary | ICD-10-CM | POA: Diagnosis not present

## 2023-10-18 DIAGNOSIS — S81801D Unspecified open wound, right lower leg, subsequent encounter: Secondary | ICD-10-CM | POA: Diagnosis not present

## 2023-10-18 DIAGNOSIS — Z7982 Long term (current) use of aspirin: Secondary | ICD-10-CM | POA: Diagnosis not present

## 2023-10-18 DIAGNOSIS — Z602 Problems related to living alone: Secondary | ICD-10-CM | POA: Diagnosis not present

## 2023-10-22 DIAGNOSIS — Z9181 History of falling: Secondary | ICD-10-CM | POA: Diagnosis not present

## 2023-10-22 DIAGNOSIS — G894 Chronic pain syndrome: Secondary | ICD-10-CM | POA: Diagnosis not present

## 2023-10-22 DIAGNOSIS — Z604 Social exclusion and rejection: Secondary | ICD-10-CM | POA: Diagnosis not present

## 2023-10-22 DIAGNOSIS — I10 Essential (primary) hypertension: Secondary | ICD-10-CM | POA: Diagnosis not present

## 2023-10-22 DIAGNOSIS — H3530 Unspecified macular degeneration: Secondary | ICD-10-CM | POA: Diagnosis not present

## 2023-10-22 DIAGNOSIS — R234 Changes in skin texture: Secondary | ICD-10-CM | POA: Diagnosis not present

## 2023-10-22 DIAGNOSIS — I2510 Atherosclerotic heart disease of native coronary artery without angina pectoris: Secondary | ICD-10-CM | POA: Diagnosis not present

## 2023-10-22 DIAGNOSIS — S81801D Unspecified open wound, right lower leg, subsequent encounter: Secondary | ICD-10-CM | POA: Diagnosis not present

## 2023-10-22 DIAGNOSIS — Z602 Problems related to living alone: Secondary | ICD-10-CM | POA: Diagnosis not present

## 2023-10-22 DIAGNOSIS — M1612 Unilateral primary osteoarthritis, left hip: Secondary | ICD-10-CM | POA: Diagnosis not present

## 2023-10-22 DIAGNOSIS — M25512 Pain in left shoulder: Secondary | ICD-10-CM | POA: Diagnosis not present

## 2023-10-22 DIAGNOSIS — E039 Hypothyroidism, unspecified: Secondary | ICD-10-CM | POA: Diagnosis not present

## 2023-10-22 DIAGNOSIS — Z7982 Long term (current) use of aspirin: Secondary | ICD-10-CM | POA: Diagnosis not present

## 2023-10-25 DIAGNOSIS — Z602 Problems related to living alone: Secondary | ICD-10-CM | POA: Diagnosis not present

## 2023-10-25 DIAGNOSIS — Z604 Social exclusion and rejection: Secondary | ICD-10-CM | POA: Diagnosis not present

## 2023-10-25 DIAGNOSIS — I10 Essential (primary) hypertension: Secondary | ICD-10-CM | POA: Diagnosis not present

## 2023-10-25 DIAGNOSIS — I2510 Atherosclerotic heart disease of native coronary artery without angina pectoris: Secondary | ICD-10-CM | POA: Diagnosis not present

## 2023-10-25 DIAGNOSIS — M25512 Pain in left shoulder: Secondary | ICD-10-CM | POA: Diagnosis not present

## 2023-10-25 DIAGNOSIS — H3530 Unspecified macular degeneration: Secondary | ICD-10-CM | POA: Diagnosis not present

## 2023-10-25 DIAGNOSIS — S81801D Unspecified open wound, right lower leg, subsequent encounter: Secondary | ICD-10-CM | POA: Diagnosis not present

## 2023-10-25 DIAGNOSIS — Z7982 Long term (current) use of aspirin: Secondary | ICD-10-CM | POA: Diagnosis not present

## 2023-10-25 DIAGNOSIS — E039 Hypothyroidism, unspecified: Secondary | ICD-10-CM | POA: Diagnosis not present

## 2023-10-25 DIAGNOSIS — R234 Changes in skin texture: Secondary | ICD-10-CM | POA: Diagnosis not present

## 2023-10-25 DIAGNOSIS — G894 Chronic pain syndrome: Secondary | ICD-10-CM | POA: Diagnosis not present

## 2023-10-25 DIAGNOSIS — M1612 Unilateral primary osteoarthritis, left hip: Secondary | ICD-10-CM | POA: Diagnosis not present

## 2023-10-25 DIAGNOSIS — Z9181 History of falling: Secondary | ICD-10-CM | POA: Diagnosis not present

## 2023-10-27 DIAGNOSIS — M25512 Pain in left shoulder: Secondary | ICD-10-CM | POA: Diagnosis not present

## 2023-10-27 DIAGNOSIS — Z602 Problems related to living alone: Secondary | ICD-10-CM | POA: Diagnosis not present

## 2023-10-27 DIAGNOSIS — Z604 Social exclusion and rejection: Secondary | ICD-10-CM | POA: Diagnosis not present

## 2023-10-27 DIAGNOSIS — H3530 Unspecified macular degeneration: Secondary | ICD-10-CM | POA: Diagnosis not present

## 2023-10-27 DIAGNOSIS — S81801D Unspecified open wound, right lower leg, subsequent encounter: Secondary | ICD-10-CM | POA: Diagnosis not present

## 2023-10-27 DIAGNOSIS — I2510 Atherosclerotic heart disease of native coronary artery without angina pectoris: Secondary | ICD-10-CM | POA: Diagnosis not present

## 2023-10-27 DIAGNOSIS — R234 Changes in skin texture: Secondary | ICD-10-CM | POA: Diagnosis not present

## 2023-10-27 DIAGNOSIS — M1612 Unilateral primary osteoarthritis, left hip: Secondary | ICD-10-CM | POA: Diagnosis not present

## 2023-10-27 DIAGNOSIS — I10 Essential (primary) hypertension: Secondary | ICD-10-CM | POA: Diagnosis not present

## 2023-10-27 DIAGNOSIS — E039 Hypothyroidism, unspecified: Secondary | ICD-10-CM | POA: Diagnosis not present

## 2023-10-27 DIAGNOSIS — Z9181 History of falling: Secondary | ICD-10-CM | POA: Diagnosis not present

## 2023-10-27 DIAGNOSIS — G894 Chronic pain syndrome: Secondary | ICD-10-CM | POA: Diagnosis not present

## 2023-10-27 DIAGNOSIS — Z7982 Long term (current) use of aspirin: Secondary | ICD-10-CM | POA: Diagnosis not present

## 2023-10-29 DIAGNOSIS — M1612 Unilateral primary osteoarthritis, left hip: Secondary | ICD-10-CM | POA: Diagnosis not present

## 2023-10-29 DIAGNOSIS — R234 Changes in skin texture: Secondary | ICD-10-CM | POA: Diagnosis not present

## 2023-10-29 DIAGNOSIS — Z604 Social exclusion and rejection: Secondary | ICD-10-CM | POA: Diagnosis not present

## 2023-10-29 DIAGNOSIS — Z602 Problems related to living alone: Secondary | ICD-10-CM | POA: Diagnosis not present

## 2023-10-29 DIAGNOSIS — S81801A Unspecified open wound, right lower leg, initial encounter: Secondary | ICD-10-CM | POA: Diagnosis not present

## 2023-10-29 DIAGNOSIS — I2510 Atherosclerotic heart disease of native coronary artery without angina pectoris: Secondary | ICD-10-CM | POA: Diagnosis not present

## 2023-10-29 DIAGNOSIS — E039 Hypothyroidism, unspecified: Secondary | ICD-10-CM | POA: Diagnosis not present

## 2023-10-29 DIAGNOSIS — I10 Essential (primary) hypertension: Secondary | ICD-10-CM | POA: Diagnosis not present

## 2023-10-29 DIAGNOSIS — H3530 Unspecified macular degeneration: Secondary | ICD-10-CM | POA: Diagnosis not present

## 2023-10-29 DIAGNOSIS — M25512 Pain in left shoulder: Secondary | ICD-10-CM | POA: Diagnosis not present

## 2023-10-29 DIAGNOSIS — Z9181 History of falling: Secondary | ICD-10-CM | POA: Diagnosis not present

## 2023-10-29 DIAGNOSIS — R531 Weakness: Secondary | ICD-10-CM | POA: Diagnosis not present

## 2023-10-29 DIAGNOSIS — S81801D Unspecified open wound, right lower leg, subsequent encounter: Secondary | ICD-10-CM | POA: Diagnosis not present

## 2023-10-29 DIAGNOSIS — G894 Chronic pain syndrome: Secondary | ICD-10-CM | POA: Diagnosis not present

## 2023-10-29 DIAGNOSIS — R443 Hallucinations, unspecified: Secondary | ICD-10-CM | POA: Diagnosis not present

## 2023-10-29 DIAGNOSIS — Z7982 Long term (current) use of aspirin: Secondary | ICD-10-CM | POA: Diagnosis not present

## 2023-11-01 DIAGNOSIS — Z9181 History of falling: Secondary | ICD-10-CM | POA: Diagnosis not present

## 2023-11-01 DIAGNOSIS — I10 Essential (primary) hypertension: Secondary | ICD-10-CM | POA: Diagnosis not present

## 2023-11-01 DIAGNOSIS — E039 Hypothyroidism, unspecified: Secondary | ICD-10-CM | POA: Diagnosis not present

## 2023-11-01 DIAGNOSIS — I2510 Atherosclerotic heart disease of native coronary artery without angina pectoris: Secondary | ICD-10-CM | POA: Diagnosis not present

## 2023-11-01 DIAGNOSIS — H3530 Unspecified macular degeneration: Secondary | ICD-10-CM | POA: Diagnosis not present

## 2023-11-01 DIAGNOSIS — Z602 Problems related to living alone: Secondary | ICD-10-CM | POA: Diagnosis not present

## 2023-11-01 DIAGNOSIS — R234 Changes in skin texture: Secondary | ICD-10-CM | POA: Diagnosis not present

## 2023-11-01 DIAGNOSIS — Z604 Social exclusion and rejection: Secondary | ICD-10-CM | POA: Diagnosis not present

## 2023-11-01 DIAGNOSIS — M1612 Unilateral primary osteoarthritis, left hip: Secondary | ICD-10-CM | POA: Diagnosis not present

## 2023-11-01 DIAGNOSIS — S81801D Unspecified open wound, right lower leg, subsequent encounter: Secondary | ICD-10-CM | POA: Diagnosis not present

## 2023-11-01 DIAGNOSIS — M25512 Pain in left shoulder: Secondary | ICD-10-CM | POA: Diagnosis not present

## 2023-11-01 DIAGNOSIS — Z7982 Long term (current) use of aspirin: Secondary | ICD-10-CM | POA: Diagnosis not present

## 2023-11-01 DIAGNOSIS — G894 Chronic pain syndrome: Secondary | ICD-10-CM | POA: Diagnosis not present

## 2023-11-03 DIAGNOSIS — M25512 Pain in left shoulder: Secondary | ICD-10-CM | POA: Diagnosis not present

## 2023-11-03 DIAGNOSIS — I2510 Atherosclerotic heart disease of native coronary artery without angina pectoris: Secondary | ICD-10-CM | POA: Diagnosis not present

## 2023-11-03 DIAGNOSIS — S81801D Unspecified open wound, right lower leg, subsequent encounter: Secondary | ICD-10-CM | POA: Diagnosis not present

## 2023-11-03 DIAGNOSIS — E039 Hypothyroidism, unspecified: Secondary | ICD-10-CM | POA: Diagnosis not present

## 2023-11-03 DIAGNOSIS — Z602 Problems related to living alone: Secondary | ICD-10-CM | POA: Diagnosis not present

## 2023-11-03 DIAGNOSIS — Z9181 History of falling: Secondary | ICD-10-CM | POA: Diagnosis not present

## 2023-11-03 DIAGNOSIS — H3530 Unspecified macular degeneration: Secondary | ICD-10-CM | POA: Diagnosis not present

## 2023-11-03 DIAGNOSIS — R234 Changes in skin texture: Secondary | ICD-10-CM | POA: Diagnosis not present

## 2023-11-03 DIAGNOSIS — Z7982 Long term (current) use of aspirin: Secondary | ICD-10-CM | POA: Diagnosis not present

## 2023-11-03 DIAGNOSIS — M1612 Unilateral primary osteoarthritis, left hip: Secondary | ICD-10-CM | POA: Diagnosis not present

## 2023-11-03 DIAGNOSIS — Z604 Social exclusion and rejection: Secondary | ICD-10-CM | POA: Diagnosis not present

## 2023-11-03 DIAGNOSIS — I10 Essential (primary) hypertension: Secondary | ICD-10-CM | POA: Diagnosis not present

## 2023-11-03 DIAGNOSIS — G894 Chronic pain syndrome: Secondary | ICD-10-CM | POA: Diagnosis not present

## 2023-11-05 DIAGNOSIS — R234 Changes in skin texture: Secondary | ICD-10-CM | POA: Diagnosis not present

## 2023-11-05 DIAGNOSIS — Z9181 History of falling: Secondary | ICD-10-CM | POA: Diagnosis not present

## 2023-11-05 DIAGNOSIS — I10 Essential (primary) hypertension: Secondary | ICD-10-CM | POA: Diagnosis not present

## 2023-11-05 DIAGNOSIS — H3530 Unspecified macular degeneration: Secondary | ICD-10-CM | POA: Diagnosis not present

## 2023-11-05 DIAGNOSIS — M1612 Unilateral primary osteoarthritis, left hip: Secondary | ICD-10-CM | POA: Diagnosis not present

## 2023-11-05 DIAGNOSIS — S81801D Unspecified open wound, right lower leg, subsequent encounter: Secondary | ICD-10-CM | POA: Diagnosis not present

## 2023-11-05 DIAGNOSIS — Z602 Problems related to living alone: Secondary | ICD-10-CM | POA: Diagnosis not present

## 2023-11-05 DIAGNOSIS — Z604 Social exclusion and rejection: Secondary | ICD-10-CM | POA: Diagnosis not present

## 2023-11-05 DIAGNOSIS — Z7982 Long term (current) use of aspirin: Secondary | ICD-10-CM | POA: Diagnosis not present

## 2023-11-05 DIAGNOSIS — G894 Chronic pain syndrome: Secondary | ICD-10-CM | POA: Diagnosis not present

## 2023-11-05 DIAGNOSIS — I2510 Atherosclerotic heart disease of native coronary artery without angina pectoris: Secondary | ICD-10-CM | POA: Diagnosis not present

## 2023-11-05 DIAGNOSIS — M25512 Pain in left shoulder: Secondary | ICD-10-CM | POA: Diagnosis not present

## 2023-11-05 DIAGNOSIS — E039 Hypothyroidism, unspecified: Secondary | ICD-10-CM | POA: Diagnosis not present

## 2023-11-07 DIAGNOSIS — G894 Chronic pain syndrome: Secondary | ICD-10-CM | POA: Diagnosis not present

## 2023-11-07 DIAGNOSIS — M170 Bilateral primary osteoarthritis of knee: Secondary | ICD-10-CM | POA: Diagnosis not present

## 2023-11-07 DIAGNOSIS — M7552 Bursitis of left shoulder: Secondary | ICD-10-CM | POA: Diagnosis not present

## 2023-11-08 DIAGNOSIS — E039 Hypothyroidism, unspecified: Secondary | ICD-10-CM | POA: Diagnosis not present

## 2023-11-08 DIAGNOSIS — M25512 Pain in left shoulder: Secondary | ICD-10-CM | POA: Diagnosis not present

## 2023-11-08 DIAGNOSIS — M1612 Unilateral primary osteoarthritis, left hip: Secondary | ICD-10-CM | POA: Diagnosis not present

## 2023-11-08 DIAGNOSIS — Z604 Social exclusion and rejection: Secondary | ICD-10-CM | POA: Diagnosis not present

## 2023-11-08 DIAGNOSIS — R234 Changes in skin texture: Secondary | ICD-10-CM | POA: Diagnosis not present

## 2023-11-08 DIAGNOSIS — I2510 Atherosclerotic heart disease of native coronary artery without angina pectoris: Secondary | ICD-10-CM | POA: Diagnosis not present

## 2023-11-08 DIAGNOSIS — Z9181 History of falling: Secondary | ICD-10-CM | POA: Diagnosis not present

## 2023-11-08 DIAGNOSIS — G894 Chronic pain syndrome: Secondary | ICD-10-CM | POA: Diagnosis not present

## 2023-11-08 DIAGNOSIS — Z7982 Long term (current) use of aspirin: Secondary | ICD-10-CM | POA: Diagnosis not present

## 2023-11-08 DIAGNOSIS — H3530 Unspecified macular degeneration: Secondary | ICD-10-CM | POA: Diagnosis not present

## 2023-11-08 DIAGNOSIS — I10 Essential (primary) hypertension: Secondary | ICD-10-CM | POA: Diagnosis not present

## 2023-11-08 DIAGNOSIS — S81801D Unspecified open wound, right lower leg, subsequent encounter: Secondary | ICD-10-CM | POA: Diagnosis not present

## 2023-11-08 DIAGNOSIS — Z602 Problems related to living alone: Secondary | ICD-10-CM | POA: Diagnosis not present

## 2023-11-09 DIAGNOSIS — R944 Abnormal results of kidney function studies: Secondary | ICD-10-CM | POA: Diagnosis not present

## 2023-11-09 DIAGNOSIS — Z79891 Long term (current) use of opiate analgesic: Secondary | ICD-10-CM | POA: Diagnosis not present

## 2023-11-09 DIAGNOSIS — R531 Weakness: Secondary | ICD-10-CM | POA: Diagnosis not present

## 2023-11-09 DIAGNOSIS — G894 Chronic pain syndrome: Secondary | ICD-10-CM | POA: Diagnosis not present

## 2023-11-10 DIAGNOSIS — M47816 Spondylosis without myelopathy or radiculopathy, lumbar region: Secondary | ICD-10-CM | POA: Diagnosis not present

## 2023-11-10 DIAGNOSIS — G894 Chronic pain syndrome: Secondary | ICD-10-CM | POA: Diagnosis not present

## 2023-11-10 DIAGNOSIS — M170 Bilateral primary osteoarthritis of knee: Secondary | ICD-10-CM | POA: Diagnosis not present

## 2023-11-10 DIAGNOSIS — M48061 Spinal stenosis, lumbar region without neurogenic claudication: Secondary | ICD-10-CM | POA: Diagnosis not present

## 2023-11-11 DIAGNOSIS — Z9181 History of falling: Secondary | ICD-10-CM | POA: Diagnosis not present

## 2023-11-11 DIAGNOSIS — M25512 Pain in left shoulder: Secondary | ICD-10-CM | POA: Diagnosis not present

## 2023-11-11 DIAGNOSIS — R234 Changes in skin texture: Secondary | ICD-10-CM | POA: Diagnosis not present

## 2023-11-11 DIAGNOSIS — E039 Hypothyroidism, unspecified: Secondary | ICD-10-CM | POA: Diagnosis not present

## 2023-11-11 DIAGNOSIS — Z602 Problems related to living alone: Secondary | ICD-10-CM | POA: Diagnosis not present

## 2023-11-11 DIAGNOSIS — M1612 Unilateral primary osteoarthritis, left hip: Secondary | ICD-10-CM | POA: Diagnosis not present

## 2023-11-11 DIAGNOSIS — G894 Chronic pain syndrome: Secondary | ICD-10-CM | POA: Diagnosis not present

## 2023-11-11 DIAGNOSIS — Z7982 Long term (current) use of aspirin: Secondary | ICD-10-CM | POA: Diagnosis not present

## 2023-11-11 DIAGNOSIS — S81801D Unspecified open wound, right lower leg, subsequent encounter: Secondary | ICD-10-CM | POA: Diagnosis not present

## 2023-11-11 DIAGNOSIS — I2510 Atherosclerotic heart disease of native coronary artery without angina pectoris: Secondary | ICD-10-CM | POA: Diagnosis not present

## 2023-11-11 DIAGNOSIS — I10 Essential (primary) hypertension: Secondary | ICD-10-CM | POA: Diagnosis not present

## 2023-11-11 DIAGNOSIS — Z604 Social exclusion and rejection: Secondary | ICD-10-CM | POA: Diagnosis not present

## 2023-11-11 DIAGNOSIS — H3530 Unspecified macular degeneration: Secondary | ICD-10-CM | POA: Diagnosis not present

## 2023-11-12 DIAGNOSIS — Z7982 Long term (current) use of aspirin: Secondary | ICD-10-CM | POA: Diagnosis not present

## 2023-11-12 DIAGNOSIS — Z602 Problems related to living alone: Secondary | ICD-10-CM | POA: Diagnosis not present

## 2023-11-12 DIAGNOSIS — Z9181 History of falling: Secondary | ICD-10-CM | POA: Diagnosis not present

## 2023-11-12 DIAGNOSIS — I2510 Atherosclerotic heart disease of native coronary artery without angina pectoris: Secondary | ICD-10-CM | POA: Diagnosis not present

## 2023-11-12 DIAGNOSIS — G894 Chronic pain syndrome: Secondary | ICD-10-CM | POA: Diagnosis not present

## 2023-11-12 DIAGNOSIS — R234 Changes in skin texture: Secondary | ICD-10-CM | POA: Diagnosis not present

## 2023-11-12 DIAGNOSIS — H3530 Unspecified macular degeneration: Secondary | ICD-10-CM | POA: Diagnosis not present

## 2023-11-12 DIAGNOSIS — E039 Hypothyroidism, unspecified: Secondary | ICD-10-CM | POA: Diagnosis not present

## 2023-11-12 DIAGNOSIS — M25512 Pain in left shoulder: Secondary | ICD-10-CM | POA: Diagnosis not present

## 2023-11-12 DIAGNOSIS — Z604 Social exclusion and rejection: Secondary | ICD-10-CM | POA: Diagnosis not present

## 2023-11-12 DIAGNOSIS — I10 Essential (primary) hypertension: Secondary | ICD-10-CM | POA: Diagnosis not present

## 2023-11-12 DIAGNOSIS — S81801D Unspecified open wound, right lower leg, subsequent encounter: Secondary | ICD-10-CM | POA: Diagnosis not present

## 2023-11-12 DIAGNOSIS — M1612 Unilateral primary osteoarthritis, left hip: Secondary | ICD-10-CM | POA: Diagnosis not present

## 2023-11-15 DIAGNOSIS — M25512 Pain in left shoulder: Secondary | ICD-10-CM | POA: Diagnosis not present

## 2023-11-15 DIAGNOSIS — Z604 Social exclusion and rejection: Secondary | ICD-10-CM | POA: Diagnosis not present

## 2023-11-15 DIAGNOSIS — R234 Changes in skin texture: Secondary | ICD-10-CM | POA: Diagnosis not present

## 2023-11-15 DIAGNOSIS — Z9181 History of falling: Secondary | ICD-10-CM | POA: Diagnosis not present

## 2023-11-15 DIAGNOSIS — Z602 Problems related to living alone: Secondary | ICD-10-CM | POA: Diagnosis not present

## 2023-11-15 DIAGNOSIS — S81801D Unspecified open wound, right lower leg, subsequent encounter: Secondary | ICD-10-CM | POA: Diagnosis not present

## 2023-11-15 DIAGNOSIS — G894 Chronic pain syndrome: Secondary | ICD-10-CM | POA: Diagnosis not present

## 2023-11-15 DIAGNOSIS — M1612 Unilateral primary osteoarthritis, left hip: Secondary | ICD-10-CM | POA: Diagnosis not present

## 2023-11-15 DIAGNOSIS — I10 Essential (primary) hypertension: Secondary | ICD-10-CM | POA: Diagnosis not present

## 2023-11-15 DIAGNOSIS — E039 Hypothyroidism, unspecified: Secondary | ICD-10-CM | POA: Diagnosis not present

## 2023-11-15 DIAGNOSIS — H3530 Unspecified macular degeneration: Secondary | ICD-10-CM | POA: Diagnosis not present

## 2023-11-15 DIAGNOSIS — I2510 Atherosclerotic heart disease of native coronary artery without angina pectoris: Secondary | ICD-10-CM | POA: Diagnosis not present

## 2023-11-15 DIAGNOSIS — Z7982 Long term (current) use of aspirin: Secondary | ICD-10-CM | POA: Diagnosis not present

## 2023-11-16 DIAGNOSIS — E039 Hypothyroidism, unspecified: Secondary | ICD-10-CM | POA: Diagnosis not present

## 2023-11-16 DIAGNOSIS — I10 Essential (primary) hypertension: Secondary | ICD-10-CM | POA: Diagnosis not present

## 2023-11-16 DIAGNOSIS — Z604 Social exclusion and rejection: Secondary | ICD-10-CM | POA: Diagnosis not present

## 2023-11-16 DIAGNOSIS — Z9181 History of falling: Secondary | ICD-10-CM | POA: Diagnosis not present

## 2023-11-16 DIAGNOSIS — Z602 Problems related to living alone: Secondary | ICD-10-CM | POA: Diagnosis not present

## 2023-11-16 DIAGNOSIS — G894 Chronic pain syndrome: Secondary | ICD-10-CM | POA: Diagnosis not present

## 2023-11-16 DIAGNOSIS — R234 Changes in skin texture: Secondary | ICD-10-CM | POA: Diagnosis not present

## 2023-11-16 DIAGNOSIS — M25512 Pain in left shoulder: Secondary | ICD-10-CM | POA: Diagnosis not present

## 2023-11-16 DIAGNOSIS — Z7982 Long term (current) use of aspirin: Secondary | ICD-10-CM | POA: Diagnosis not present

## 2023-11-16 DIAGNOSIS — S81801D Unspecified open wound, right lower leg, subsequent encounter: Secondary | ICD-10-CM | POA: Diagnosis not present

## 2023-11-16 DIAGNOSIS — H3530 Unspecified macular degeneration: Secondary | ICD-10-CM | POA: Diagnosis not present

## 2023-11-16 DIAGNOSIS — M1612 Unilateral primary osteoarthritis, left hip: Secondary | ICD-10-CM | POA: Diagnosis not present

## 2023-11-16 DIAGNOSIS — I2510 Atherosclerotic heart disease of native coronary artery without angina pectoris: Secondary | ICD-10-CM | POA: Diagnosis not present

## 2023-11-17 DIAGNOSIS — M25512 Pain in left shoulder: Secondary | ICD-10-CM | POA: Diagnosis not present

## 2023-11-17 DIAGNOSIS — H3530 Unspecified macular degeneration: Secondary | ICD-10-CM | POA: Diagnosis not present

## 2023-11-17 DIAGNOSIS — Z604 Social exclusion and rejection: Secondary | ICD-10-CM | POA: Diagnosis not present

## 2023-11-17 DIAGNOSIS — I2510 Atherosclerotic heart disease of native coronary artery without angina pectoris: Secondary | ICD-10-CM | POA: Diagnosis not present

## 2023-11-17 DIAGNOSIS — Z7982 Long term (current) use of aspirin: Secondary | ICD-10-CM | POA: Diagnosis not present

## 2023-11-17 DIAGNOSIS — I10 Essential (primary) hypertension: Secondary | ICD-10-CM | POA: Diagnosis not present

## 2023-11-17 DIAGNOSIS — M1612 Unilateral primary osteoarthritis, left hip: Secondary | ICD-10-CM | POA: Diagnosis not present

## 2023-11-17 DIAGNOSIS — E039 Hypothyroidism, unspecified: Secondary | ICD-10-CM | POA: Diagnosis not present

## 2023-11-17 DIAGNOSIS — Z9181 History of falling: Secondary | ICD-10-CM | POA: Diagnosis not present

## 2023-11-17 DIAGNOSIS — S81801D Unspecified open wound, right lower leg, subsequent encounter: Secondary | ICD-10-CM | POA: Diagnosis not present

## 2023-11-17 DIAGNOSIS — R234 Changes in skin texture: Secondary | ICD-10-CM | POA: Diagnosis not present

## 2023-11-17 DIAGNOSIS — Z602 Problems related to living alone: Secondary | ICD-10-CM | POA: Diagnosis not present

## 2023-11-17 DIAGNOSIS — G894 Chronic pain syndrome: Secondary | ICD-10-CM | POA: Diagnosis not present

## 2023-11-18 DIAGNOSIS — F320 Major depressive disorder, single episode, mild: Secondary | ICD-10-CM | POA: Diagnosis not present

## 2023-11-18 DIAGNOSIS — F419 Anxiety disorder, unspecified: Secondary | ICD-10-CM | POA: Diagnosis not present

## 2023-11-19 DIAGNOSIS — R234 Changes in skin texture: Secondary | ICD-10-CM | POA: Diagnosis not present

## 2023-11-19 DIAGNOSIS — Z9181 History of falling: Secondary | ICD-10-CM | POA: Diagnosis not present

## 2023-11-19 DIAGNOSIS — Z7982 Long term (current) use of aspirin: Secondary | ICD-10-CM | POA: Diagnosis not present

## 2023-11-19 DIAGNOSIS — E039 Hypothyroidism, unspecified: Secondary | ICD-10-CM | POA: Diagnosis not present

## 2023-11-19 DIAGNOSIS — G894 Chronic pain syndrome: Secondary | ICD-10-CM | POA: Diagnosis not present

## 2023-11-19 DIAGNOSIS — M1612 Unilateral primary osteoarthritis, left hip: Secondary | ICD-10-CM | POA: Diagnosis not present

## 2023-11-19 DIAGNOSIS — Z602 Problems related to living alone: Secondary | ICD-10-CM | POA: Diagnosis not present

## 2023-11-19 DIAGNOSIS — M25512 Pain in left shoulder: Secondary | ICD-10-CM | POA: Diagnosis not present

## 2023-11-19 DIAGNOSIS — I2510 Atherosclerotic heart disease of native coronary artery without angina pectoris: Secondary | ICD-10-CM | POA: Diagnosis not present

## 2023-11-19 DIAGNOSIS — H3530 Unspecified macular degeneration: Secondary | ICD-10-CM | POA: Diagnosis not present

## 2023-11-19 DIAGNOSIS — S81801D Unspecified open wound, right lower leg, subsequent encounter: Secondary | ICD-10-CM | POA: Diagnosis not present

## 2023-11-19 DIAGNOSIS — I10 Essential (primary) hypertension: Secondary | ICD-10-CM | POA: Diagnosis not present

## 2023-11-19 DIAGNOSIS — Z604 Social exclusion and rejection: Secondary | ICD-10-CM | POA: Diagnosis not present

## 2023-11-22 DIAGNOSIS — M25512 Pain in left shoulder: Secondary | ICD-10-CM | POA: Diagnosis not present

## 2023-11-22 DIAGNOSIS — I10 Essential (primary) hypertension: Secondary | ICD-10-CM | POA: Diagnosis not present

## 2023-11-22 DIAGNOSIS — G894 Chronic pain syndrome: Secondary | ICD-10-CM | POA: Diagnosis not present

## 2023-11-22 DIAGNOSIS — S81801D Unspecified open wound, right lower leg, subsequent encounter: Secondary | ICD-10-CM | POA: Diagnosis not present

## 2023-11-22 DIAGNOSIS — M1612 Unilateral primary osteoarthritis, left hip: Secondary | ICD-10-CM | POA: Diagnosis not present

## 2023-11-22 DIAGNOSIS — H3530 Unspecified macular degeneration: Secondary | ICD-10-CM | POA: Diagnosis not present

## 2023-11-22 DIAGNOSIS — Z7982 Long term (current) use of aspirin: Secondary | ICD-10-CM | POA: Diagnosis not present

## 2023-11-22 DIAGNOSIS — Z604 Social exclusion and rejection: Secondary | ICD-10-CM | POA: Diagnosis not present

## 2023-11-22 DIAGNOSIS — Z602 Problems related to living alone: Secondary | ICD-10-CM | POA: Diagnosis not present

## 2023-11-22 DIAGNOSIS — I2510 Atherosclerotic heart disease of native coronary artery without angina pectoris: Secondary | ICD-10-CM | POA: Diagnosis not present

## 2023-11-22 DIAGNOSIS — R234 Changes in skin texture: Secondary | ICD-10-CM | POA: Diagnosis not present

## 2023-11-22 DIAGNOSIS — E039 Hypothyroidism, unspecified: Secondary | ICD-10-CM | POA: Diagnosis not present

## 2023-11-22 DIAGNOSIS — Z9181 History of falling: Secondary | ICD-10-CM | POA: Diagnosis not present

## 2023-11-23 DIAGNOSIS — M1612 Unilateral primary osteoarthritis, left hip: Secondary | ICD-10-CM | POA: Diagnosis not present

## 2023-11-23 DIAGNOSIS — E039 Hypothyroidism, unspecified: Secondary | ICD-10-CM | POA: Diagnosis not present

## 2023-11-23 DIAGNOSIS — S81801D Unspecified open wound, right lower leg, subsequent encounter: Secondary | ICD-10-CM | POA: Diagnosis not present

## 2023-11-23 DIAGNOSIS — I10 Essential (primary) hypertension: Secondary | ICD-10-CM | POA: Diagnosis not present

## 2023-11-23 DIAGNOSIS — Z7982 Long term (current) use of aspirin: Secondary | ICD-10-CM | POA: Diagnosis not present

## 2023-11-23 DIAGNOSIS — Z9181 History of falling: Secondary | ICD-10-CM | POA: Diagnosis not present

## 2023-11-23 DIAGNOSIS — I2510 Atherosclerotic heart disease of native coronary artery without angina pectoris: Secondary | ICD-10-CM | POA: Diagnosis not present

## 2023-11-23 DIAGNOSIS — R234 Changes in skin texture: Secondary | ICD-10-CM | POA: Diagnosis not present

## 2023-11-23 DIAGNOSIS — H3530 Unspecified macular degeneration: Secondary | ICD-10-CM | POA: Diagnosis not present

## 2023-11-23 DIAGNOSIS — Z602 Problems related to living alone: Secondary | ICD-10-CM | POA: Diagnosis not present

## 2023-11-23 DIAGNOSIS — Z604 Social exclusion and rejection: Secondary | ICD-10-CM | POA: Diagnosis not present

## 2023-11-23 DIAGNOSIS — G894 Chronic pain syndrome: Secondary | ICD-10-CM | POA: Diagnosis not present

## 2023-11-23 DIAGNOSIS — M25512 Pain in left shoulder: Secondary | ICD-10-CM | POA: Diagnosis not present

## 2023-11-25 DIAGNOSIS — Z604 Social exclusion and rejection: Secondary | ICD-10-CM | POA: Diagnosis not present

## 2023-11-25 DIAGNOSIS — R234 Changes in skin texture: Secondary | ICD-10-CM | POA: Diagnosis not present

## 2023-11-25 DIAGNOSIS — M1612 Unilateral primary osteoarthritis, left hip: Secondary | ICD-10-CM | POA: Diagnosis not present

## 2023-11-25 DIAGNOSIS — I10 Essential (primary) hypertension: Secondary | ICD-10-CM | POA: Diagnosis not present

## 2023-11-25 DIAGNOSIS — Z7982 Long term (current) use of aspirin: Secondary | ICD-10-CM | POA: Diagnosis not present

## 2023-11-25 DIAGNOSIS — G894 Chronic pain syndrome: Secondary | ICD-10-CM | POA: Diagnosis not present

## 2023-11-25 DIAGNOSIS — Z602 Problems related to living alone: Secondary | ICD-10-CM | POA: Diagnosis not present

## 2023-11-25 DIAGNOSIS — S81801D Unspecified open wound, right lower leg, subsequent encounter: Secondary | ICD-10-CM | POA: Diagnosis not present

## 2023-11-25 DIAGNOSIS — H3530 Unspecified macular degeneration: Secondary | ICD-10-CM | POA: Diagnosis not present

## 2023-11-25 DIAGNOSIS — Z9181 History of falling: Secondary | ICD-10-CM | POA: Diagnosis not present

## 2023-11-25 DIAGNOSIS — E039 Hypothyroidism, unspecified: Secondary | ICD-10-CM | POA: Diagnosis not present

## 2023-11-25 DIAGNOSIS — I2510 Atherosclerotic heart disease of native coronary artery without angina pectoris: Secondary | ICD-10-CM | POA: Diagnosis not present

## 2023-11-25 DIAGNOSIS — M25512 Pain in left shoulder: Secondary | ICD-10-CM | POA: Diagnosis not present

## 2023-11-29 DIAGNOSIS — I2510 Atherosclerotic heart disease of native coronary artery without angina pectoris: Secondary | ICD-10-CM | POA: Diagnosis not present

## 2023-11-29 DIAGNOSIS — Z604 Social exclusion and rejection: Secondary | ICD-10-CM | POA: Diagnosis not present

## 2023-11-29 DIAGNOSIS — Z602 Problems related to living alone: Secondary | ICD-10-CM | POA: Diagnosis not present

## 2023-11-29 DIAGNOSIS — H3530 Unspecified macular degeneration: Secondary | ICD-10-CM | POA: Diagnosis not present

## 2023-11-29 DIAGNOSIS — M1612 Unilateral primary osteoarthritis, left hip: Secondary | ICD-10-CM | POA: Diagnosis not present

## 2023-11-29 DIAGNOSIS — Z79891 Long term (current) use of opiate analgesic: Secondary | ICD-10-CM | POA: Diagnosis not present

## 2023-11-29 DIAGNOSIS — Z9181 History of falling: Secondary | ICD-10-CM | POA: Diagnosis not present

## 2023-11-29 DIAGNOSIS — M25512 Pain in left shoulder: Secondary | ICD-10-CM | POA: Diagnosis not present

## 2023-11-29 DIAGNOSIS — G894 Chronic pain syndrome: Secondary | ICD-10-CM | POA: Diagnosis not present

## 2023-11-29 DIAGNOSIS — Z791 Long term (current) use of non-steroidal anti-inflammatories (NSAID): Secondary | ICD-10-CM | POA: Diagnosis not present

## 2023-11-29 DIAGNOSIS — Z7982 Long term (current) use of aspirin: Secondary | ICD-10-CM | POA: Diagnosis not present

## 2023-11-29 DIAGNOSIS — E039 Hypothyroidism, unspecified: Secondary | ICD-10-CM | POA: Diagnosis not present

## 2023-11-29 DIAGNOSIS — I10 Essential (primary) hypertension: Secondary | ICD-10-CM | POA: Diagnosis not present

## 2023-11-29 DIAGNOSIS — M6281 Muscle weakness (generalized): Secondary | ICD-10-CM | POA: Diagnosis not present

## 2023-12-01 DIAGNOSIS — M48061 Spinal stenosis, lumbar region without neurogenic claudication: Secondary | ICD-10-CM | POA: Diagnosis not present

## 2023-12-01 DIAGNOSIS — M545 Low back pain, unspecified: Secondary | ICD-10-CM | POA: Diagnosis not present

## 2023-12-01 DIAGNOSIS — G8929 Other chronic pain: Secondary | ICD-10-CM | POA: Diagnosis not present

## 2023-12-01 DIAGNOSIS — R54 Age-related physical debility: Secondary | ICD-10-CM | POA: Diagnosis not present

## 2023-12-01 DIAGNOSIS — M1990 Unspecified osteoarthritis, unspecified site: Secondary | ICD-10-CM | POA: Diagnosis not present

## 2023-12-02 DIAGNOSIS — Z602 Problems related to living alone: Secondary | ICD-10-CM | POA: Diagnosis not present

## 2023-12-02 DIAGNOSIS — I10 Essential (primary) hypertension: Secondary | ICD-10-CM | POA: Diagnosis not present

## 2023-12-02 DIAGNOSIS — Z604 Social exclusion and rejection: Secondary | ICD-10-CM | POA: Diagnosis not present

## 2023-12-02 DIAGNOSIS — M1612 Unilateral primary osteoarthritis, left hip: Secondary | ICD-10-CM | POA: Diagnosis not present

## 2023-12-02 DIAGNOSIS — M25512 Pain in left shoulder: Secondary | ICD-10-CM | POA: Diagnosis not present

## 2023-12-02 DIAGNOSIS — Z791 Long term (current) use of non-steroidal anti-inflammatories (NSAID): Secondary | ICD-10-CM | POA: Diagnosis not present

## 2023-12-02 DIAGNOSIS — Z7982 Long term (current) use of aspirin: Secondary | ICD-10-CM | POA: Diagnosis not present

## 2023-12-02 DIAGNOSIS — Z9181 History of falling: Secondary | ICD-10-CM | POA: Diagnosis not present

## 2023-12-02 DIAGNOSIS — G894 Chronic pain syndrome: Secondary | ICD-10-CM | POA: Diagnosis not present

## 2023-12-02 DIAGNOSIS — Z79891 Long term (current) use of opiate analgesic: Secondary | ICD-10-CM | POA: Diagnosis not present

## 2023-12-02 DIAGNOSIS — H3530 Unspecified macular degeneration: Secondary | ICD-10-CM | POA: Diagnosis not present

## 2023-12-02 DIAGNOSIS — E039 Hypothyroidism, unspecified: Secondary | ICD-10-CM | POA: Diagnosis not present

## 2023-12-02 DIAGNOSIS — M6281 Muscle weakness (generalized): Secondary | ICD-10-CM | POA: Diagnosis not present

## 2023-12-02 DIAGNOSIS — I2510 Atherosclerotic heart disease of native coronary artery without angina pectoris: Secondary | ICD-10-CM | POA: Diagnosis not present

## 2023-12-06 DIAGNOSIS — I2510 Atherosclerotic heart disease of native coronary artery without angina pectoris: Secondary | ICD-10-CM | POA: Diagnosis not present

## 2023-12-06 DIAGNOSIS — M1612 Unilateral primary osteoarthritis, left hip: Secondary | ICD-10-CM | POA: Diagnosis not present

## 2023-12-06 DIAGNOSIS — M25512 Pain in left shoulder: Secondary | ICD-10-CM | POA: Diagnosis not present

## 2023-12-06 DIAGNOSIS — Z9181 History of falling: Secondary | ICD-10-CM | POA: Diagnosis not present

## 2023-12-06 DIAGNOSIS — M6281 Muscle weakness (generalized): Secondary | ICD-10-CM | POA: Diagnosis not present

## 2023-12-06 DIAGNOSIS — I10 Essential (primary) hypertension: Secondary | ICD-10-CM | POA: Diagnosis not present

## 2023-12-06 DIAGNOSIS — Z604 Social exclusion and rejection: Secondary | ICD-10-CM | POA: Diagnosis not present

## 2023-12-06 DIAGNOSIS — H3530 Unspecified macular degeneration: Secondary | ICD-10-CM | POA: Diagnosis not present

## 2023-12-06 DIAGNOSIS — Z602 Problems related to living alone: Secondary | ICD-10-CM | POA: Diagnosis not present

## 2023-12-06 DIAGNOSIS — G894 Chronic pain syndrome: Secondary | ICD-10-CM | POA: Diagnosis not present

## 2023-12-06 DIAGNOSIS — Z7982 Long term (current) use of aspirin: Secondary | ICD-10-CM | POA: Diagnosis not present

## 2023-12-06 DIAGNOSIS — Z79891 Long term (current) use of opiate analgesic: Secondary | ICD-10-CM | POA: Diagnosis not present

## 2023-12-06 DIAGNOSIS — E039 Hypothyroidism, unspecified: Secondary | ICD-10-CM | POA: Diagnosis not present

## 2023-12-06 DIAGNOSIS — Z791 Long term (current) use of non-steroidal anti-inflammatories (NSAID): Secondary | ICD-10-CM | POA: Diagnosis not present

## 2023-12-08 DIAGNOSIS — G894 Chronic pain syndrome: Secondary | ICD-10-CM | POA: Diagnosis not present

## 2023-12-08 DIAGNOSIS — R54 Age-related physical debility: Secondary | ICD-10-CM | POA: Diagnosis not present

## 2023-12-08 DIAGNOSIS — M48061 Spinal stenosis, lumbar region without neurogenic claudication: Secondary | ICD-10-CM | POA: Diagnosis not present

## 2023-12-08 DIAGNOSIS — M1990 Unspecified osteoarthritis, unspecified site: Secondary | ICD-10-CM | POA: Diagnosis not present

## 2023-12-08 DIAGNOSIS — G8929 Other chronic pain: Secondary | ICD-10-CM | POA: Diagnosis not present

## 2023-12-08 DIAGNOSIS — M545 Low back pain, unspecified: Secondary | ICD-10-CM | POA: Diagnosis not present

## 2023-12-08 DIAGNOSIS — M170 Bilateral primary osteoarthritis of knee: Secondary | ICD-10-CM | POA: Diagnosis not present

## 2023-12-08 DIAGNOSIS — M7552 Bursitis of left shoulder: Secondary | ICD-10-CM | POA: Diagnosis not present

## 2023-12-10 DIAGNOSIS — Z604 Social exclusion and rejection: Secondary | ICD-10-CM | POA: Diagnosis not present

## 2023-12-10 DIAGNOSIS — M1612 Unilateral primary osteoarthritis, left hip: Secondary | ICD-10-CM | POA: Diagnosis not present

## 2023-12-10 DIAGNOSIS — H3530 Unspecified macular degeneration: Secondary | ICD-10-CM | POA: Diagnosis not present

## 2023-12-10 DIAGNOSIS — Z9181 History of falling: Secondary | ICD-10-CM | POA: Diagnosis not present

## 2023-12-10 DIAGNOSIS — Z79891 Long term (current) use of opiate analgesic: Secondary | ICD-10-CM | POA: Diagnosis not present

## 2023-12-10 DIAGNOSIS — M6281 Muscle weakness (generalized): Secondary | ICD-10-CM | POA: Diagnosis not present

## 2023-12-10 DIAGNOSIS — Z791 Long term (current) use of non-steroidal anti-inflammatories (NSAID): Secondary | ICD-10-CM | POA: Diagnosis not present

## 2023-12-10 DIAGNOSIS — I2510 Atherosclerotic heart disease of native coronary artery without angina pectoris: Secondary | ICD-10-CM | POA: Diagnosis not present

## 2023-12-10 DIAGNOSIS — Z602 Problems related to living alone: Secondary | ICD-10-CM | POA: Diagnosis not present

## 2023-12-10 DIAGNOSIS — M25512 Pain in left shoulder: Secondary | ICD-10-CM | POA: Diagnosis not present

## 2023-12-10 DIAGNOSIS — G894 Chronic pain syndrome: Secondary | ICD-10-CM | POA: Diagnosis not present

## 2023-12-10 DIAGNOSIS — I10 Essential (primary) hypertension: Secondary | ICD-10-CM | POA: Diagnosis not present

## 2023-12-10 DIAGNOSIS — E039 Hypothyroidism, unspecified: Secondary | ICD-10-CM | POA: Diagnosis not present

## 2023-12-10 DIAGNOSIS — Z7982 Long term (current) use of aspirin: Secondary | ICD-10-CM | POA: Diagnosis not present

## 2023-12-13 DIAGNOSIS — I10 Essential (primary) hypertension: Secondary | ICD-10-CM | POA: Diagnosis not present

## 2023-12-13 DIAGNOSIS — M6281 Muscle weakness (generalized): Secondary | ICD-10-CM | POA: Diagnosis not present

## 2023-12-13 DIAGNOSIS — M25512 Pain in left shoulder: Secondary | ICD-10-CM | POA: Diagnosis not present

## 2023-12-13 DIAGNOSIS — Z602 Problems related to living alone: Secondary | ICD-10-CM | POA: Diagnosis not present

## 2023-12-13 DIAGNOSIS — I2510 Atherosclerotic heart disease of native coronary artery without angina pectoris: Secondary | ICD-10-CM | POA: Diagnosis not present

## 2023-12-13 DIAGNOSIS — M1612 Unilateral primary osteoarthritis, left hip: Secondary | ICD-10-CM | POA: Diagnosis not present

## 2023-12-13 DIAGNOSIS — E039 Hypothyroidism, unspecified: Secondary | ICD-10-CM | POA: Diagnosis not present

## 2023-12-13 DIAGNOSIS — H3530 Unspecified macular degeneration: Secondary | ICD-10-CM | POA: Diagnosis not present

## 2023-12-13 DIAGNOSIS — Z7982 Long term (current) use of aspirin: Secondary | ICD-10-CM | POA: Diagnosis not present

## 2023-12-13 DIAGNOSIS — Z604 Social exclusion and rejection: Secondary | ICD-10-CM | POA: Diagnosis not present

## 2023-12-13 DIAGNOSIS — Z9181 History of falling: Secondary | ICD-10-CM | POA: Diagnosis not present

## 2023-12-13 DIAGNOSIS — Z791 Long term (current) use of non-steroidal anti-inflammatories (NSAID): Secondary | ICD-10-CM | POA: Diagnosis not present

## 2023-12-13 DIAGNOSIS — Z79891 Long term (current) use of opiate analgesic: Secondary | ICD-10-CM | POA: Diagnosis not present

## 2023-12-13 DIAGNOSIS — G894 Chronic pain syndrome: Secondary | ICD-10-CM | POA: Diagnosis not present

## 2023-12-17 DIAGNOSIS — M1612 Unilateral primary osteoarthritis, left hip: Secondary | ICD-10-CM | POA: Diagnosis not present

## 2023-12-17 DIAGNOSIS — Z602 Problems related to living alone: Secondary | ICD-10-CM | POA: Diagnosis not present

## 2023-12-17 DIAGNOSIS — Z79891 Long term (current) use of opiate analgesic: Secondary | ICD-10-CM | POA: Diagnosis not present

## 2023-12-17 DIAGNOSIS — Z7982 Long term (current) use of aspirin: Secondary | ICD-10-CM | POA: Diagnosis not present

## 2023-12-17 DIAGNOSIS — Z9181 History of falling: Secondary | ICD-10-CM | POA: Diagnosis not present

## 2023-12-17 DIAGNOSIS — G894 Chronic pain syndrome: Secondary | ICD-10-CM | POA: Diagnosis not present

## 2023-12-17 DIAGNOSIS — I10 Essential (primary) hypertension: Secondary | ICD-10-CM | POA: Diagnosis not present

## 2023-12-17 DIAGNOSIS — M25512 Pain in left shoulder: Secondary | ICD-10-CM | POA: Diagnosis not present

## 2023-12-17 DIAGNOSIS — E039 Hypothyroidism, unspecified: Secondary | ICD-10-CM | POA: Diagnosis not present

## 2023-12-17 DIAGNOSIS — I2510 Atherosclerotic heart disease of native coronary artery without angina pectoris: Secondary | ICD-10-CM | POA: Diagnosis not present

## 2023-12-17 DIAGNOSIS — M6281 Muscle weakness (generalized): Secondary | ICD-10-CM | POA: Diagnosis not present

## 2023-12-17 DIAGNOSIS — H3530 Unspecified macular degeneration: Secondary | ICD-10-CM | POA: Diagnosis not present

## 2023-12-17 DIAGNOSIS — Z791 Long term (current) use of non-steroidal anti-inflammatories (NSAID): Secondary | ICD-10-CM | POA: Diagnosis not present

## 2023-12-17 DIAGNOSIS — Z604 Social exclusion and rejection: Secondary | ICD-10-CM | POA: Diagnosis not present

## 2023-12-24 DIAGNOSIS — Z791 Long term (current) use of non-steroidal anti-inflammatories (NSAID): Secondary | ICD-10-CM | POA: Diagnosis not present

## 2023-12-24 DIAGNOSIS — H3530 Unspecified macular degeneration: Secondary | ICD-10-CM | POA: Diagnosis not present

## 2023-12-24 DIAGNOSIS — E039 Hypothyroidism, unspecified: Secondary | ICD-10-CM | POA: Diagnosis not present

## 2023-12-24 DIAGNOSIS — I10 Essential (primary) hypertension: Secondary | ICD-10-CM | POA: Diagnosis not present

## 2023-12-24 DIAGNOSIS — Z602 Problems related to living alone: Secondary | ICD-10-CM | POA: Diagnosis not present

## 2023-12-24 DIAGNOSIS — Z79891 Long term (current) use of opiate analgesic: Secondary | ICD-10-CM | POA: Diagnosis not present

## 2023-12-24 DIAGNOSIS — Z7982 Long term (current) use of aspirin: Secondary | ICD-10-CM | POA: Diagnosis not present

## 2023-12-24 DIAGNOSIS — M25512 Pain in left shoulder: Secondary | ICD-10-CM | POA: Diagnosis not present

## 2023-12-24 DIAGNOSIS — M6281 Muscle weakness (generalized): Secondary | ICD-10-CM | POA: Diagnosis not present

## 2023-12-24 DIAGNOSIS — Z604 Social exclusion and rejection: Secondary | ICD-10-CM | POA: Diagnosis not present

## 2023-12-24 DIAGNOSIS — G894 Chronic pain syndrome: Secondary | ICD-10-CM | POA: Diagnosis not present

## 2023-12-24 DIAGNOSIS — M1612 Unilateral primary osteoarthritis, left hip: Secondary | ICD-10-CM | POA: Diagnosis not present

## 2023-12-24 DIAGNOSIS — Z9181 History of falling: Secondary | ICD-10-CM | POA: Diagnosis not present

## 2023-12-24 DIAGNOSIS — I2510 Atherosclerotic heart disease of native coronary artery without angina pectoris: Secondary | ICD-10-CM | POA: Diagnosis not present

## 2023-12-31 DIAGNOSIS — M1990 Unspecified osteoarthritis, unspecified site: Secondary | ICD-10-CM | POA: Diagnosis not present

## 2023-12-31 DIAGNOSIS — R54 Age-related physical debility: Secondary | ICD-10-CM | POA: Diagnosis not present

## 2023-12-31 DIAGNOSIS — G8929 Other chronic pain: Secondary | ICD-10-CM | POA: Diagnosis not present

## 2023-12-31 DIAGNOSIS — M48061 Spinal stenosis, lumbar region without neurogenic claudication: Secondary | ICD-10-CM | POA: Diagnosis not present

## 2023-12-31 DIAGNOSIS — M545 Low back pain, unspecified: Secondary | ICD-10-CM | POA: Diagnosis not present

## 2024-01-07 DIAGNOSIS — R54 Age-related physical debility: Secondary | ICD-10-CM | POA: Diagnosis not present

## 2024-01-07 DIAGNOSIS — M7552 Bursitis of left shoulder: Secondary | ICD-10-CM | POA: Diagnosis not present

## 2024-01-07 DIAGNOSIS — M1990 Unspecified osteoarthritis, unspecified site: Secondary | ICD-10-CM | POA: Diagnosis not present

## 2024-01-07 DIAGNOSIS — G8929 Other chronic pain: Secondary | ICD-10-CM | POA: Diagnosis not present

## 2024-01-07 DIAGNOSIS — M170 Bilateral primary osteoarthritis of knee: Secondary | ICD-10-CM | POA: Diagnosis not present

## 2024-01-07 DIAGNOSIS — M545 Low back pain, unspecified: Secondary | ICD-10-CM | POA: Diagnosis not present

## 2024-01-07 DIAGNOSIS — G894 Chronic pain syndrome: Secondary | ICD-10-CM | POA: Diagnosis not present

## 2024-01-07 DIAGNOSIS — M48061 Spinal stenosis, lumbar region without neurogenic claudication: Secondary | ICD-10-CM | POA: Diagnosis not present

## 2024-01-10 DIAGNOSIS — M48061 Spinal stenosis, lumbar region without neurogenic claudication: Secondary | ICD-10-CM | POA: Diagnosis not present

## 2024-01-10 DIAGNOSIS — M170 Bilateral primary osteoarthritis of knee: Secondary | ICD-10-CM | POA: Diagnosis not present

## 2024-01-10 DIAGNOSIS — G894 Chronic pain syndrome: Secondary | ICD-10-CM | POA: Diagnosis not present

## 2024-01-10 DIAGNOSIS — M47816 Spondylosis without myelopathy or radiculopathy, lumbar region: Secondary | ICD-10-CM | POA: Diagnosis not present

## 2024-01-31 DIAGNOSIS — R54 Age-related physical debility: Secondary | ICD-10-CM | POA: Diagnosis not present

## 2024-01-31 DIAGNOSIS — G8929 Other chronic pain: Secondary | ICD-10-CM | POA: Diagnosis not present

## 2024-01-31 DIAGNOSIS — M545 Low back pain, unspecified: Secondary | ICD-10-CM | POA: Diagnosis not present

## 2024-01-31 DIAGNOSIS — M1990 Unspecified osteoarthritis, unspecified site: Secondary | ICD-10-CM | POA: Diagnosis not present

## 2024-01-31 DIAGNOSIS — M48061 Spinal stenosis, lumbar region without neurogenic claudication: Secondary | ICD-10-CM | POA: Diagnosis not present

## 2024-02-07 DIAGNOSIS — G8929 Other chronic pain: Secondary | ICD-10-CM | POA: Diagnosis not present

## 2024-02-07 DIAGNOSIS — M545 Low back pain, unspecified: Secondary | ICD-10-CM | POA: Diagnosis not present

## 2024-02-07 DIAGNOSIS — M48061 Spinal stenosis, lumbar region without neurogenic claudication: Secondary | ICD-10-CM | POA: Diagnosis not present

## 2024-02-07 DIAGNOSIS — M7552 Bursitis of left shoulder: Secondary | ICD-10-CM | POA: Diagnosis not present

## 2024-02-07 DIAGNOSIS — R54 Age-related physical debility: Secondary | ICD-10-CM | POA: Diagnosis not present

## 2024-02-07 DIAGNOSIS — M170 Bilateral primary osteoarthritis of knee: Secondary | ICD-10-CM | POA: Diagnosis not present

## 2024-02-07 DIAGNOSIS — G894 Chronic pain syndrome: Secondary | ICD-10-CM | POA: Diagnosis not present

## 2024-02-07 DIAGNOSIS — M1990 Unspecified osteoarthritis, unspecified site: Secondary | ICD-10-CM | POA: Diagnosis not present

## 2024-03-02 DIAGNOSIS — M48061 Spinal stenosis, lumbar region without neurogenic claudication: Secondary | ICD-10-CM | POA: Diagnosis not present

## 2024-03-02 DIAGNOSIS — G8929 Other chronic pain: Secondary | ICD-10-CM | POA: Diagnosis not present

## 2024-03-02 DIAGNOSIS — M1990 Unspecified osteoarthritis, unspecified site: Secondary | ICD-10-CM | POA: Diagnosis not present

## 2024-03-02 DIAGNOSIS — R54 Age-related physical debility: Secondary | ICD-10-CM | POA: Diagnosis not present

## 2024-03-02 DIAGNOSIS — M545 Low back pain, unspecified: Secondary | ICD-10-CM | POA: Diagnosis not present

## 2024-03-07 DIAGNOSIS — M47816 Spondylosis without myelopathy or radiculopathy, lumbar region: Secondary | ICD-10-CM | POA: Diagnosis not present

## 2024-03-07 DIAGNOSIS — M48061 Spinal stenosis, lumbar region without neurogenic claudication: Secondary | ICD-10-CM | POA: Diagnosis not present

## 2024-03-07 DIAGNOSIS — M170 Bilateral primary osteoarthritis of knee: Secondary | ICD-10-CM | POA: Diagnosis not present

## 2024-03-07 DIAGNOSIS — G894 Chronic pain syndrome: Secondary | ICD-10-CM | POA: Diagnosis not present

## 2024-03-09 DIAGNOSIS — M545 Low back pain, unspecified: Secondary | ICD-10-CM | POA: Diagnosis not present

## 2024-03-09 DIAGNOSIS — M48061 Spinal stenosis, lumbar region without neurogenic claudication: Secondary | ICD-10-CM | POA: Diagnosis not present

## 2024-03-09 DIAGNOSIS — G8929 Other chronic pain: Secondary | ICD-10-CM | POA: Diagnosis not present

## 2024-03-09 DIAGNOSIS — M7552 Bursitis of left shoulder: Secondary | ICD-10-CM | POA: Diagnosis not present

## 2024-03-09 DIAGNOSIS — R54 Age-related physical debility: Secondary | ICD-10-CM | POA: Diagnosis not present

## 2024-03-09 DIAGNOSIS — M170 Bilateral primary osteoarthritis of knee: Secondary | ICD-10-CM | POA: Diagnosis not present

## 2024-03-09 DIAGNOSIS — M1990 Unspecified osteoarthritis, unspecified site: Secondary | ICD-10-CM | POA: Diagnosis not present

## 2024-03-09 DIAGNOSIS — G894 Chronic pain syndrome: Secondary | ICD-10-CM | POA: Diagnosis not present

## 2024-03-20 DIAGNOSIS — E7800 Pure hypercholesterolemia, unspecified: Secondary | ICD-10-CM | POA: Diagnosis not present

## 2024-03-20 DIAGNOSIS — I10 Essential (primary) hypertension: Secondary | ICD-10-CM | POA: Diagnosis not present

## 2024-03-22 DIAGNOSIS — H3530 Unspecified macular degeneration: Secondary | ICD-10-CM | POA: Diagnosis not present

## 2024-03-22 DIAGNOSIS — M1612 Unilateral primary osteoarthritis, left hip: Secondary | ICD-10-CM | POA: Diagnosis not present

## 2024-03-22 DIAGNOSIS — G8929 Other chronic pain: Secondary | ICD-10-CM | POA: Diagnosis not present

## 2024-03-22 DIAGNOSIS — G9519 Other vascular myelopathies: Secondary | ICD-10-CM | POA: Diagnosis not present

## 2024-03-22 DIAGNOSIS — I10 Essential (primary) hypertension: Secondary | ICD-10-CM | POA: Diagnosis not present

## 2024-03-22 DIAGNOSIS — E7800 Pure hypercholesterolemia, unspecified: Secondary | ICD-10-CM | POA: Diagnosis not present

## 2024-03-22 DIAGNOSIS — F419 Anxiety disorder, unspecified: Secondary | ICD-10-CM | POA: Diagnosis not present

## 2024-03-22 DIAGNOSIS — E039 Hypothyroidism, unspecified: Secondary | ICD-10-CM | POA: Diagnosis not present

## 2024-03-22 DIAGNOSIS — R29898 Other symptoms and signs involving the musculoskeletal system: Secondary | ICD-10-CM | POA: Diagnosis not present

## 2024-03-22 DIAGNOSIS — I700 Atherosclerosis of aorta: Secondary | ICD-10-CM | POA: Diagnosis not present

## 2024-03-22 DIAGNOSIS — Z9089 Acquired absence of other organs: Secondary | ICD-10-CM | POA: Diagnosis not present

## 2024-03-22 DIAGNOSIS — Z556 Problems related to health literacy: Secondary | ICD-10-CM | POA: Diagnosis not present

## 2024-03-22 DIAGNOSIS — Z79890 Hormone replacement therapy (postmenopausal): Secondary | ICD-10-CM | POA: Diagnosis not present

## 2024-03-22 DIAGNOSIS — K5790 Diverticulosis of intestine, part unspecified, without perforation or abscess without bleeding: Secondary | ICD-10-CM | POA: Diagnosis not present

## 2024-03-22 DIAGNOSIS — F32A Depression, unspecified: Secondary | ICD-10-CM | POA: Diagnosis not present

## 2024-04-01 DIAGNOSIS — M545 Low back pain, unspecified: Secondary | ICD-10-CM | POA: Diagnosis not present

## 2024-04-01 DIAGNOSIS — M48061 Spinal stenosis, lumbar region without neurogenic claudication: Secondary | ICD-10-CM | POA: Diagnosis not present

## 2024-04-01 DIAGNOSIS — G8929 Other chronic pain: Secondary | ICD-10-CM | POA: Diagnosis not present

## 2024-04-01 DIAGNOSIS — R54 Age-related physical debility: Secondary | ICD-10-CM | POA: Diagnosis not present

## 2024-04-06 DIAGNOSIS — E7800 Pure hypercholesterolemia, unspecified: Secondary | ICD-10-CM | POA: Diagnosis not present

## 2024-04-08 DIAGNOSIS — M7552 Bursitis of left shoulder: Secondary | ICD-10-CM | POA: Diagnosis not present

## 2024-04-08 DIAGNOSIS — M170 Bilateral primary osteoarthritis of knee: Secondary | ICD-10-CM | POA: Diagnosis not present

## 2024-04-08 DIAGNOSIS — M545 Low back pain, unspecified: Secondary | ICD-10-CM | POA: Diagnosis not present

## 2024-04-08 DIAGNOSIS — G8929 Other chronic pain: Secondary | ICD-10-CM | POA: Diagnosis not present

## 2024-04-08 DIAGNOSIS — M48061 Spinal stenosis, lumbar region without neurogenic claudication: Secondary | ICD-10-CM | POA: Diagnosis not present

## 2024-04-08 DIAGNOSIS — R54 Age-related physical debility: Secondary | ICD-10-CM | POA: Diagnosis not present

## 2024-04-10 DIAGNOSIS — F419 Anxiety disorder, unspecified: Secondary | ICD-10-CM | POA: Diagnosis not present

## 2024-04-10 DIAGNOSIS — R29898 Other symptoms and signs involving the musculoskeletal system: Secondary | ICD-10-CM | POA: Diagnosis not present

## 2024-04-10 DIAGNOSIS — H3530 Unspecified macular degeneration: Secondary | ICD-10-CM | POA: Diagnosis not present

## 2024-04-10 DIAGNOSIS — G9519 Other vascular myelopathies: Secondary | ICD-10-CM | POA: Diagnosis not present

## 2024-04-10 DIAGNOSIS — G8929 Other chronic pain: Secondary | ICD-10-CM | POA: Diagnosis not present

## 2024-04-10 DIAGNOSIS — I10 Essential (primary) hypertension: Secondary | ICD-10-CM | POA: Diagnosis not present

## 2024-04-10 DIAGNOSIS — E7800 Pure hypercholesterolemia, unspecified: Secondary | ICD-10-CM | POA: Diagnosis not present

## 2024-04-10 DIAGNOSIS — K5790 Diverticulosis of intestine, part unspecified, without perforation or abscess without bleeding: Secondary | ICD-10-CM | POA: Diagnosis not present

## 2024-04-10 DIAGNOSIS — M1612 Unilateral primary osteoarthritis, left hip: Secondary | ICD-10-CM | POA: Diagnosis not present

## 2024-04-10 DIAGNOSIS — I700 Atherosclerosis of aorta: Secondary | ICD-10-CM | POA: Diagnosis not present

## 2024-04-10 DIAGNOSIS — Z79890 Hormone replacement therapy (postmenopausal): Secondary | ICD-10-CM | POA: Diagnosis not present

## 2024-04-10 DIAGNOSIS — F32A Depression, unspecified: Secondary | ICD-10-CM | POA: Diagnosis not present

## 2024-04-10 DIAGNOSIS — Z9089 Acquired absence of other organs: Secondary | ICD-10-CM | POA: Diagnosis not present

## 2024-04-10 DIAGNOSIS — Z556 Problems related to health literacy: Secondary | ICD-10-CM | POA: Diagnosis not present

## 2024-04-14 DIAGNOSIS — R531 Weakness: Secondary | ICD-10-CM | POA: Diagnosis not present

## 2024-04-14 DIAGNOSIS — M1990 Unspecified osteoarthritis, unspecified site: Secondary | ICD-10-CM | POA: Diagnosis not present

## 2024-04-14 DIAGNOSIS — M51362 Other intervertebral disc degeneration, lumbar region with discogenic back pain and lower extremity pain: Secondary | ICD-10-CM | POA: Diagnosis not present

## 2024-04-17 DIAGNOSIS — E039 Hypothyroidism, unspecified: Secondary | ICD-10-CM | POA: Diagnosis not present

## 2024-04-17 DIAGNOSIS — R262 Difficulty in walking, not elsewhere classified: Secondary | ICD-10-CM | POA: Diagnosis not present

## 2024-04-17 DIAGNOSIS — I151 Hypertension secondary to other renal disorders: Secondary | ICD-10-CM | POA: Diagnosis not present

## 2024-04-17 DIAGNOSIS — R531 Weakness: Secondary | ICD-10-CM | POA: Diagnosis not present

## 2024-04-19 ENCOUNTER — Ambulatory Visit: Admitting: Podiatry

## 2024-04-19 ENCOUNTER — Encounter: Payer: Self-pay | Admitting: Podiatry

## 2024-04-19 DIAGNOSIS — M79674 Pain in right toe(s): Secondary | ICD-10-CM | POA: Diagnosis not present

## 2024-04-19 DIAGNOSIS — B351 Tinea unguium: Secondary | ICD-10-CM

## 2024-04-19 DIAGNOSIS — M79675 Pain in left toe(s): Secondary | ICD-10-CM

## 2024-04-19 DIAGNOSIS — G603 Idiopathic progressive neuropathy: Secondary | ICD-10-CM

## 2024-04-19 NOTE — Progress Notes (Signed)
 This patient returns to my office for at risk foot care.  This patient requires this care by a professional since this patient will be at risk due to having neuropathy. This patient is unable to cut nails herself since the patient cannot reach her nails.These nails are painful walking and wearing shoes.  This patient presents for at risk foot care today.  General Appearance  Alert, conversant and in no acute stress.  Vascular  Dorsalis pedis and posterior tibial  pulses are  weakly palpable  bilaterally.  Capillary return is within normal limits  bilaterally. Temperature is within normal limits  bilaterally.  Neurologic  Senn-Weinstein monofilament wire test diminished   bilaterally. Muscle power within normal limits bilaterally.  Nails Thick disfigured discolored nails with subungual debris  from hallux to fifth toes bilaterally. No evidence of bacterial infection or drainage bilaterally.  Orthopedic  No limitations of motion  feet .  No crepitus or effusions noted.  No bony pathology or digital deformities noted.  Skin  normotropic skin with no porokeratosis noted bilaterally.  No signs of infections or ulcers noted.     Onychomycosis  Pain in right toes  Pain in left toes  Consent was obtained for treatment procedures.   Mechanical debridement of nails 1-5  bilaterally performed with a nail nipper.  Filed with dremel without incident.    Return office visit    prn                 Told patient to return for periodic foot care and evaluation due to potential at risk complications.   Cordella Bold DPM

## 2024-04-20 DIAGNOSIS — M51362 Other intervertebral disc degeneration, lumbar region with discogenic back pain and lower extremity pain: Secondary | ICD-10-CM | POA: Diagnosis not present

## 2024-04-20 DIAGNOSIS — M1990 Unspecified osteoarthritis, unspecified site: Secondary | ICD-10-CM | POA: Diagnosis not present

## 2024-04-20 DIAGNOSIS — R531 Weakness: Secondary | ICD-10-CM | POA: Diagnosis not present

## 2024-04-25 DIAGNOSIS — R531 Weakness: Secondary | ICD-10-CM | POA: Diagnosis not present

## 2024-04-25 DIAGNOSIS — E039 Hypothyroidism, unspecified: Secondary | ICD-10-CM | POA: Diagnosis not present

## 2024-04-25 DIAGNOSIS — I151 Hypertension secondary to other renal disorders: Secondary | ICD-10-CM | POA: Diagnosis not present

## 2024-04-25 DIAGNOSIS — R262 Difficulty in walking, not elsewhere classified: Secondary | ICD-10-CM | POA: Diagnosis not present

## 2024-04-27 DIAGNOSIS — M1990 Unspecified osteoarthritis, unspecified site: Secondary | ICD-10-CM | POA: Diagnosis not present

## 2024-04-27 DIAGNOSIS — M51362 Other intervertebral disc degeneration, lumbar region with discogenic back pain and lower extremity pain: Secondary | ICD-10-CM | POA: Diagnosis not present

## 2024-04-27 DIAGNOSIS — R531 Weakness: Secondary | ICD-10-CM | POA: Diagnosis not present

## 2024-05-02 DIAGNOSIS — R54 Age-related physical debility: Secondary | ICD-10-CM | POA: Diagnosis not present

## 2024-05-02 DIAGNOSIS — M1990 Unspecified osteoarthritis, unspecified site: Secondary | ICD-10-CM | POA: Diagnosis not present

## 2024-05-02 DIAGNOSIS — M48061 Spinal stenosis, lumbar region without neurogenic claudication: Secondary | ICD-10-CM | POA: Diagnosis not present

## 2024-05-02 DIAGNOSIS — G8929 Other chronic pain: Secondary | ICD-10-CM | POA: Diagnosis not present

## 2024-05-02 DIAGNOSIS — M545 Low back pain, unspecified: Secondary | ICD-10-CM | POA: Diagnosis not present

## 2024-05-03 DIAGNOSIS — M47816 Spondylosis without myelopathy or radiculopathy, lumbar region: Secondary | ICD-10-CM | POA: Diagnosis not present

## 2024-05-03 DIAGNOSIS — G894 Chronic pain syndrome: Secondary | ICD-10-CM | POA: Diagnosis not present

## 2024-05-03 DIAGNOSIS — M170 Bilateral primary osteoarthritis of knee: Secondary | ICD-10-CM | POA: Diagnosis not present

## 2024-05-03 DIAGNOSIS — M48061 Spinal stenosis, lumbar region without neurogenic claudication: Secondary | ICD-10-CM | POA: Diagnosis not present

## 2024-05-04 DIAGNOSIS — M51362 Other intervertebral disc degeneration, lumbar region with discogenic back pain and lower extremity pain: Secondary | ICD-10-CM | POA: Diagnosis not present

## 2024-05-04 DIAGNOSIS — I151 Hypertension secondary to other renal disorders: Secondary | ICD-10-CM | POA: Diagnosis not present

## 2024-05-04 DIAGNOSIS — M1990 Unspecified osteoarthritis, unspecified site: Secondary | ICD-10-CM | POA: Diagnosis not present

## 2024-05-04 DIAGNOSIS — E039 Hypothyroidism, unspecified: Secondary | ICD-10-CM | POA: Diagnosis not present

## 2024-05-04 DIAGNOSIS — R531 Weakness: Secondary | ICD-10-CM | POA: Diagnosis not present

## 2024-05-04 DIAGNOSIS — R262 Difficulty in walking, not elsewhere classified: Secondary | ICD-10-CM | POA: Diagnosis not present

## 2024-05-09 DIAGNOSIS — M48061 Spinal stenosis, lumbar region without neurogenic claudication: Secondary | ICD-10-CM | POA: Diagnosis not present

## 2024-05-09 DIAGNOSIS — M545 Low back pain, unspecified: Secondary | ICD-10-CM | POA: Diagnosis not present

## 2024-05-09 DIAGNOSIS — G8929 Other chronic pain: Secondary | ICD-10-CM | POA: Diagnosis not present

## 2024-05-09 DIAGNOSIS — M1990 Unspecified osteoarthritis, unspecified site: Secondary | ICD-10-CM | POA: Diagnosis not present

## 2024-05-09 DIAGNOSIS — R54 Age-related physical debility: Secondary | ICD-10-CM | POA: Diagnosis not present

## 2024-05-11 DIAGNOSIS — M51362 Other intervertebral disc degeneration, lumbar region with discogenic back pain and lower extremity pain: Secondary | ICD-10-CM | POA: Diagnosis not present

## 2024-05-11 DIAGNOSIS — E039 Hypothyroidism, unspecified: Secondary | ICD-10-CM | POA: Diagnosis not present

## 2024-05-11 DIAGNOSIS — R262 Difficulty in walking, not elsewhere classified: Secondary | ICD-10-CM | POA: Diagnosis not present

## 2024-05-11 DIAGNOSIS — M1990 Unspecified osteoarthritis, unspecified site: Secondary | ICD-10-CM | POA: Diagnosis not present

## 2024-05-11 DIAGNOSIS — R531 Weakness: Secondary | ICD-10-CM | POA: Diagnosis not present

## 2024-05-11 DIAGNOSIS — I151 Hypertension secondary to other renal disorders: Secondary | ICD-10-CM | POA: Diagnosis not present

## 2024-05-18 DIAGNOSIS — M1990 Unspecified osteoarthritis, unspecified site: Secondary | ICD-10-CM | POA: Diagnosis not present

## 2024-05-18 DIAGNOSIS — I151 Hypertension secondary to other renal disorders: Secondary | ICD-10-CM | POA: Diagnosis not present

## 2024-05-18 DIAGNOSIS — R262 Difficulty in walking, not elsewhere classified: Secondary | ICD-10-CM | POA: Diagnosis not present

## 2024-05-18 DIAGNOSIS — E039 Hypothyroidism, unspecified: Secondary | ICD-10-CM | POA: Diagnosis not present

## 2024-05-18 DIAGNOSIS — R531 Weakness: Secondary | ICD-10-CM | POA: Diagnosis not present

## 2024-05-18 DIAGNOSIS — M51362 Other intervertebral disc degeneration, lumbar region with discogenic back pain and lower extremity pain: Secondary | ICD-10-CM | POA: Diagnosis not present

## 2024-05-23 DIAGNOSIS — M51362 Other intervertebral disc degeneration, lumbar region with discogenic back pain and lower extremity pain: Secondary | ICD-10-CM | POA: Diagnosis not present

## 2024-05-23 DIAGNOSIS — I151 Hypertension secondary to other renal disorders: Secondary | ICD-10-CM | POA: Diagnosis not present

## 2024-05-23 DIAGNOSIS — R262 Difficulty in walking, not elsewhere classified: Secondary | ICD-10-CM | POA: Diagnosis not present

## 2024-05-23 DIAGNOSIS — M1990 Unspecified osteoarthritis, unspecified site: Secondary | ICD-10-CM | POA: Diagnosis not present

## 2024-05-23 DIAGNOSIS — E039 Hypothyroidism, unspecified: Secondary | ICD-10-CM | POA: Diagnosis not present

## 2024-05-23 DIAGNOSIS — R531 Weakness: Secondary | ICD-10-CM | POA: Diagnosis not present

## 2024-05-29 DIAGNOSIS — R531 Weakness: Secondary | ICD-10-CM | POA: Diagnosis not present

## 2024-05-29 DIAGNOSIS — I151 Hypertension secondary to other renal disorders: Secondary | ICD-10-CM | POA: Diagnosis not present

## 2024-05-29 DIAGNOSIS — R262 Difficulty in walking, not elsewhere classified: Secondary | ICD-10-CM | POA: Diagnosis not present

## 2024-05-29 DIAGNOSIS — E039 Hypothyroidism, unspecified: Secondary | ICD-10-CM | POA: Diagnosis not present

## 2024-06-01 DIAGNOSIS — R531 Weakness: Secondary | ICD-10-CM | POA: Diagnosis not present

## 2024-06-01 DIAGNOSIS — M48061 Spinal stenosis, lumbar region without neurogenic claudication: Secondary | ICD-10-CM | POA: Diagnosis not present

## 2024-06-01 DIAGNOSIS — M51362 Other intervertebral disc degeneration, lumbar region with discogenic back pain and lower extremity pain: Secondary | ICD-10-CM | POA: Diagnosis not present

## 2024-06-01 DIAGNOSIS — R54 Age-related physical debility: Secondary | ICD-10-CM | POA: Diagnosis not present

## 2024-06-01 DIAGNOSIS — G8929 Other chronic pain: Secondary | ICD-10-CM | POA: Diagnosis not present

## 2024-06-01 DIAGNOSIS — M545 Low back pain, unspecified: Secondary | ICD-10-CM | POA: Diagnosis not present

## 2024-06-01 DIAGNOSIS — M1990 Unspecified osteoarthritis, unspecified site: Secondary | ICD-10-CM | POA: Diagnosis not present

## 2024-06-08 DIAGNOSIS — M545 Low back pain, unspecified: Secondary | ICD-10-CM | POA: Diagnosis not present

## 2024-06-08 DIAGNOSIS — M48061 Spinal stenosis, lumbar region without neurogenic claudication: Secondary | ICD-10-CM | POA: Diagnosis not present

## 2024-06-08 DIAGNOSIS — G8929 Other chronic pain: Secondary | ICD-10-CM | POA: Diagnosis not present

## 2024-06-08 DIAGNOSIS — R54 Age-related physical debility: Secondary | ICD-10-CM | POA: Diagnosis not present

## 2024-06-08 DIAGNOSIS — M1990 Unspecified osteoarthritis, unspecified site: Secondary | ICD-10-CM | POA: Diagnosis not present

## 2024-06-09 DIAGNOSIS — M51362 Other intervertebral disc degeneration, lumbar region with discogenic back pain and lower extremity pain: Secondary | ICD-10-CM | POA: Diagnosis not present

## 2024-06-09 DIAGNOSIS — R531 Weakness: Secondary | ICD-10-CM | POA: Diagnosis not present

## 2024-06-09 DIAGNOSIS — I151 Hypertension secondary to other renal disorders: Secondary | ICD-10-CM | POA: Diagnosis not present

## 2024-06-09 DIAGNOSIS — R262 Difficulty in walking, not elsewhere classified: Secondary | ICD-10-CM | POA: Diagnosis not present

## 2024-06-09 DIAGNOSIS — M1990 Unspecified osteoarthritis, unspecified site: Secondary | ICD-10-CM | POA: Diagnosis not present

## 2024-06-09 DIAGNOSIS — E039 Hypothyroidism, unspecified: Secondary | ICD-10-CM | POA: Diagnosis not present

## 2024-06-12 DIAGNOSIS — E039 Hypothyroidism, unspecified: Secondary | ICD-10-CM | POA: Diagnosis not present

## 2024-06-12 DIAGNOSIS — R262 Difficulty in walking, not elsewhere classified: Secondary | ICD-10-CM | POA: Diagnosis not present

## 2024-06-12 DIAGNOSIS — I151 Hypertension secondary to other renal disorders: Secondary | ICD-10-CM | POA: Diagnosis not present

## 2024-06-12 DIAGNOSIS — R531 Weakness: Secondary | ICD-10-CM | POA: Diagnosis not present

## 2024-06-14 DIAGNOSIS — R531 Weakness: Secondary | ICD-10-CM | POA: Diagnosis not present

## 2024-06-14 DIAGNOSIS — M51362 Other intervertebral disc degeneration, lumbar region with discogenic back pain and lower extremity pain: Secondary | ICD-10-CM | POA: Diagnosis not present

## 2024-06-14 DIAGNOSIS — M1990 Unspecified osteoarthritis, unspecified site: Secondary | ICD-10-CM | POA: Diagnosis not present

## 2024-06-23 DIAGNOSIS — E039 Hypothyroidism, unspecified: Secondary | ICD-10-CM | POA: Diagnosis not present

## 2024-06-23 DIAGNOSIS — R531 Weakness: Secondary | ICD-10-CM | POA: Diagnosis not present

## 2024-06-23 DIAGNOSIS — I151 Hypertension secondary to other renal disorders: Secondary | ICD-10-CM | POA: Diagnosis not present

## 2024-06-23 DIAGNOSIS — R262 Difficulty in walking, not elsewhere classified: Secondary | ICD-10-CM | POA: Diagnosis not present

## 2024-07-13 ENCOUNTER — Emergency Department (HOSPITAL_COMMUNITY)

## 2024-07-13 ENCOUNTER — Other Ambulatory Visit: Payer: Self-pay

## 2024-07-13 ENCOUNTER — Emergency Department (HOSPITAL_COMMUNITY)
Admission: EM | Admit: 2024-07-13 | Discharge: 2024-07-13 | Disposition: A | Discharge status: Home / Self Care | Source: Skilled Nursing Facility | Attending: Emergency Medicine | Admitting: Emergency Medicine

## 2024-07-13 DIAGNOSIS — W06XXXA Fall from bed, initial encounter: Secondary | ICD-10-CM | POA: Diagnosis not present

## 2024-07-13 DIAGNOSIS — E039 Hypothyroidism, unspecified: Secondary | ICD-10-CM | POA: Diagnosis not present

## 2024-07-13 DIAGNOSIS — S81811A Laceration without foreign body, right lower leg, initial encounter: Secondary | ICD-10-CM | POA: Insufficient documentation

## 2024-07-13 DIAGNOSIS — Z79899 Other long term (current) drug therapy: Secondary | ICD-10-CM | POA: Insufficient documentation

## 2024-07-13 DIAGNOSIS — Z7982 Long term (current) use of aspirin: Secondary | ICD-10-CM | POA: Diagnosis not present

## 2024-07-13 DIAGNOSIS — I10 Essential (primary) hypertension: Secondary | ICD-10-CM | POA: Insufficient documentation

## 2024-07-13 DIAGNOSIS — S8991XA Unspecified injury of right lower leg, initial encounter: Secondary | ICD-10-CM | POA: Diagnosis present

## 2024-07-13 DIAGNOSIS — Y92129 Unspecified place in nursing home as the place of occurrence of the external cause: Secondary | ICD-10-CM | POA: Diagnosis not present

## 2024-07-13 MED ORDER — LIDOCAINE-EPINEPHRINE (PF) 2 %-1:200000 IJ SOLN
10.0000 mL | Freq: Once | INTRAMUSCULAR | Status: AC
  Administered 2024-07-13: 10 mL via INTRADERMAL
  Filled 2024-07-13: qty 20

## 2024-07-13 MED ORDER — OXYCODONE-ACETAMINOPHEN 5-325 MG PO TABS
1.0000 | ORAL_TABLET | Freq: Once | ORAL | Status: AC
  Administered 2024-07-13: 1 via ORAL
  Filled 2024-07-13: qty 1

## 2024-07-13 NOTE — ED Triage Notes (Signed)
 Pt was getting out of bed, fell on the edge of the bed, had a lac to the right lower leg appx 2 inches, pt denies hitting head, blood thinners or LOC, pt was on the ground for 1 hour post fall d/t nursing home not being able to get to pt.

## 2024-07-13 NOTE — ED Provider Triage Note (Signed)
 Emergency Medicine Provider Triage Evaluation Note  Shelia Jimenez , a 89 y.o. female  was evaluated in triage.  Pt complains of laceration to right leg, denies fall, no head injury, no other injuries, says TDAP is UTD.  Review of Systems  Positive: Laceration  Negative: Fall, head injury  Physical Exam  BP (!) 149/80 (BP Location: Left Arm)   Pulse 80   Temp 98.1 F (36.7 C) (Oral)   Resp 16   Ht 5' 6 (1.676 m)   Wt 59 kg   SpO2 100%   BMI 20.98 kg/m  Gen:   Awake, no distress    Resp:  Normal effort   MSK:   Moves extremities without difficulty   Other:     Medical Decision Making  Medically screening exam initiated at 9:08 AM.  Appropriate orders placed.  Ermalee Mealy Zobel was informed that the remainder of the evaluation will be completed by another provider, this initial triage assessment does not replace that evaluation, and the importance of remaining in the ED until their evaluation is complete.      Lenor Hollering, MD 07/13/24 [phone]

## 2024-07-13 NOTE — ED Provider Notes (Signed)
 " [place] EMERGENCY DEPARTMENT AT [facility] Provider Note   CSN: [id number] Arrival date & time: 07/13/24  9157     Patient presents with: Shelia Jimenez is a 89 y.o. female.   The history is provided by the patient and medical records. No language interpreter was used.  Fall   89 year old female with history of hypertension, hypothyroidism, hypercholesterolemia, brought here via EMS from her nursing facility for evaluation of leg injury.  This morning as patient was getting out of bed, her leg struck the edge of the bed and she suffered a laceration to her right lower extremity.  She did sit down on the ground but denies falling down to the ground.  She denies hitting her head or loss of consciousness.  She was only plan for period of time before nursing staff can come to aid.  Aside from pain from the laceration of her right lower leg she is without any other complaint.  Denies any precipitating symptoms prior to the injury.  She is not on blood thinning medication.    Prior to Admission medications  Medication Sig Start Date End Date Taking? Authorizing Provider  acetaminophen  (TYLENOL ) 325 MG tablet Take 650 mg by mouth every 6 (six) hours as needed.    [provider]  amLODipine (NORVASC) 2.5 MG tablet TAKE ONE TABLET DAILY 02/02/13   [provider]  aspirin 81 MG tablet Take 81 mg by mouth daily.    [provider]  Calcium Carbonate-Vitamin D (CALTRATE 600+D PO) Take by mouth 2 (two) times daily.    [provider]  ciclopirox (LOPROX) 0.77 % cream APPLY SPARINGLY TO AFFECTED AREA TWICE A DAY 01/25/23   [provider]  fish oil-omega-3 fatty acids 1000 MG capsule Take 2 g by mouth daily.    [provider]  levothyroxine (SYNTHROID, LEVOTHROID) 50 MCG tablet Take 50 mcg by mouth daily before breakfast.    [provider]  losartan (COZAAR) 100 MG tablet Take 100 mg by mouth daily.    [provider]  Multiple Vitamins-Calcium (ONE-A-DAY WOMENS PO) Take by mouth daily.    [provider]  oxycodone -acetaminophen  (PERCOCET) 2.5-325 MG tablet Take 1 tablet by mouth every 4 (four) hours as needed for pain.    [provider]  polycarbophil (FIBERCON) 625 MG tablet Take 625 mg by mouth 2 (two) times daily.    [provider]    Allergies: Ciprofloxacin, Escitalopram oxalate, Metoprolol  tartrate, Ace inhibitors, Amoxicillin, Celebrex [celecoxib], Crestor [rosuvastatin], Iodine, and Statins    Review of Systems  Constitutional:  Negative for fever.  Skin:  Positive for wound.    Updated Vital Signs BP (!) 149/80 (BP Location: Left Arm)   Pulse 80   Temp 98.1 F (36.7 C) (Oral)   Resp 16   Ht 5' 6 (1.676 m)   Wt 59 kg   SpO2 100%   BMI 20.98 kg/m   Physical Exam Vitals and nursing note reviewed.  Constitutional:      General: She is not in acute distress.    Appearance: She is well-developed.  HENT:     Head: Atraumatic.  Eyes:     Conjunctiva/sclera: Conjunctivae normal.  Pulmonary:     Effort: Pulmonary effort is normal.  Musculoskeletal:        General: Signs of injury (Right lower extremity: There is a stellate laceration approximately 4 cm in diameter noted to the mid anterior tib-fib mildly tender  to palpation without any crepitus.  No foreign body noted.  Not actively bleeding.  Sensation intact distally) present.     Cervical back: Neck supple.  Skin:    Findings: No rash.  Neurological:     Mental Status: She is alert.  Psychiatric:        Mood and Affect: Mood normal.     (all labs ordered are listed, but only abnormal results are displayed) Labs Reviewed - No data to display  EKG: None  Radiology: DG Tibia/Fibula Right Result Date: 07/13/2024 CLINICAL DATA:  Clemens out of bed with right lower leg pain. EXAM: RIGHT TIBIA AND FIBULA - 2 VIEW COMPARISON:  None Available. FINDINGS: Moderate osteoarthritic change of  the knee most notable over the patellofemoral joint. Right tibia/fibula are otherwise unremarkable. No evidence of acute fracture or dislocation. IMPRESSION: 1. No acute findings. 2. Moderate osteoarthritic change of the knee. Electronically Signed   By: Toribio Agreste M.D.   On: 07/13/2024 09:37     .Laceration Repair  Date/Time: 07/13/2024 12:17 PM  Performed by: Nivia Colon, PA-C Authorized by: Nivia Colon, PA-C   Consent:    Consent obtained:  Verbal   Consent given by:  Patient   Risks, benefits, and alternatives were discussed: yes     Risks discussed:  Infection and poor wound healing   Alternatives discussed:  No treatment Universal protocol:    Procedure explained and questions answered to patient or proxy's satisfaction: yes     Relevant documents present and verified: yes     Patient identity confirmed:  Verbally with patient and arm band Laceration details:    Location:  Leg   Leg location:  R lower leg   Length (cm):  4   Depth (mm):  6 Pre-procedure details:    Preparation:  Patient was prepped and draped in usual sterile fashion and imaging obtained to evaluate for foreign bodies Exploration:    Limited defect created (wound extended): yes     Hemostasis achieved with:  Epinephrine    Imaging outcome: foreign body not noted     Wound exploration: wound explored through full range of motion and entire depth of wound visualized     Wound extent: muscle damage     Wound extent: no nerve damage and no underlying fracture     Contaminated: no   Treatment:    Area cleansed with:  Saline   Amount of cleaning:  Standard   Irrigation solution:  Sterile saline   Irrigation method:  Pressure wash   Debridement:  None   Undermining:  None   Scar revision: no   Skin repair:    Repair method:  Sutures   Suture size:  5-0   Suture material:  Prolene   Suture technique:  Simple interrupted   Number of sutures:  8 Approximation:    Approximation:  Close Repair type:     Repair type:  Complex Post-procedure details:    Dressing:  Non-adherent dressing   Procedure completion:  Tolerated well, no immediate complications    Medications Ordered in the ED  lidocaine -EPINEPHrine  (XYLOCAINE  W/EPI) 2 %-1:200000 (PF) injection 10 mL (10 mLs Intradermal Given by Other 07/13/24 1145)  oxyCODONE -acetaminophen  (PERCOCET/ROXICET) 5-325 MG per tablet 1 tablet (1 tablet Oral Given 07/13/24 1110)                                    Medical Decision Making  Risk Prescription drug management.   BP (!) 149/80 (BP Location: Left Arm)   Pulse 80   Temp 98.1 F (36.7 C) (Oral)   Resp 16   Ht 5' 6 (1.676 m)   Wt 59 kg   SpO2 100%   BMI 20.98 kg/m   35:71 AM 89 year old female with history of hypertension, hypothyroidism, hypercholesterolemia, brought here via EMS from her nursing facility for evaluation of leg injury.  This morning as patient was getting out of bed, her leg struck the edge of the bed and she suffered a laceration to her right lower extremity.  She did sit down on the ground but denies falling down to the ground.  She denies hitting her head or loss of consciousness.  She was only plan for period of time before nursing staff can come to aid.  Aside from pain from the laceration of her right lower leg she is without any other complaint.  Denies any precipitating symptoms prior to the injury.  She is not on blood thinning medication.   Exam notable for laceration to the right mid anterior tib-fib region amenable for laceration repair.  No other injury noted.  X-ray of the right tib-fib was obtained independent viewed inter by me without any acute findings.  Patient given pain medication for pain.  She does not have any precipitating symptoms prior to the fall therefore additional workup not indicated.  Patient had a deep V-shaped laceration with a skin flap.  No foreign body noted.  Wound was thoroughly irrigated.  I was able to perform laceration repair with  good cosmesis.  Patient will need to have suture removed in 7 days.  Recommend appropriate wound care instruction as well as monitor for potential signs of infection.  Care discussed with attending Dr. Armenta.      Final diagnoses:  Laceration of right lower extremity excluding thigh, initial encounter    ED Discharge Orders     None          Nivia Colon, PA-C 07/13/24 1236    Armenta Canning, MD 07/17/24 1702  "

## 2024-07-13 NOTE — Discharge Instructions (Signed)
 Please wash the wound with gentle soap and water and pat it dry daily and apply a new dressing to the wound.  Monitor for any signs of infection which includes redness, increasing pain, or purulent discharge.  Please have sutures removed in 7 days.  You may take over-the-counter Tylenol  or your Percocet as needed for pain.
